# Patient Record
Sex: Male | Born: 1950
Health system: Southern US, Community
[De-identification: ages and names within clinical notes are randomized; demographics above are authoritative.]

## PROBLEM LIST (undated history)

## (undated) DIAGNOSIS — T4145XA Adverse effect of unspecified anesthetic, initial encounter: Secondary | ICD-10-CM

## (undated) DIAGNOSIS — Z8739 Personal history of other diseases of the musculoskeletal system and connective tissue: Secondary | ICD-10-CM

## (undated) DIAGNOSIS — Z87442 Personal history of urinary calculi: Secondary | ICD-10-CM

## (undated) DIAGNOSIS — M549 Dorsalgia, unspecified: Secondary | ICD-10-CM

## (undated) DIAGNOSIS — Z8619 Personal history of other infectious and parasitic diseases: Secondary | ICD-10-CM

## (undated) DIAGNOSIS — L719 Rosacea, unspecified: Secondary | ICD-10-CM

## (undated) DIAGNOSIS — G8929 Other chronic pain: Secondary | ICD-10-CM

## (undated) DIAGNOSIS — E785 Hyperlipidemia, unspecified: Secondary | ICD-10-CM

## (undated) DIAGNOSIS — E119 Type 2 diabetes mellitus without complications: Secondary | ICD-10-CM

## (undated) DIAGNOSIS — T8859XA Other complications of anesthesia, initial encounter: Secondary | ICD-10-CM

## (undated) HISTORY — PX: COLONOSCOPY: SHX174

---
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
## 1988-10-25 DIAGNOSIS — Z8619 Personal history of other infectious and parasitic diseases: Secondary | ICD-10-CM

## 1988-10-25 HISTORY — DX: Personal history of other infectious and parasitic diseases: Z86.19

## 1996-10-25 HISTORY — PX: CERVICAL FUSION: SHX112

## 2002-06-25 ENCOUNTER — Emergency Department (HOSPITAL_COMMUNITY): Admission: EM | Admit: 2002-06-25 | Discharge: 2002-06-25 | Payer: Self-pay | Admitting: Emergency Medicine

## 2014-02-11 ENCOUNTER — Other Ambulatory Visit: Payer: Self-pay | Admitting: Orthopedic Surgery

## 2014-02-11 DIAGNOSIS — M545 Low back pain, unspecified: Secondary | ICD-10-CM

## 2014-02-18 ENCOUNTER — Ambulatory Visit
Admission: RE | Admit: 2014-02-18 | Discharge: 2014-02-18 | Disposition: A | Discharge status: Home / Self Care | Payer: BC Managed Care – PPO | Source: Ambulatory Visit | Attending: Orthopedic Surgery | Admitting: Orthopedic Surgery

## 2014-02-18 DIAGNOSIS — M545 Low back pain, unspecified: Secondary | ICD-10-CM

## 2014-03-13 ENCOUNTER — Other Ambulatory Visit: Payer: Self-pay | Admitting: Neurosurgery

## 2014-04-04 ENCOUNTER — Encounter (HOSPITAL_COMMUNITY): Payer: Self-pay | Admitting: Pharmacy Technician

## 2014-04-04 NOTE — Pre-Procedure Instructions (Signed)
Mario [name]  04/04/2014   Your procedure is scheduled on:  Mon, June 22 @ 1:20 PM  Report to [facility]  at 10:20 AM.  Call this number if you have problems the morning of surgery: [phone]   Remember:   Do not eat food or drink liquids after midnight.                Stop taking your Aspirin,Advil,and Fish Oil. No Goody's,BC's,Aleve,or any Herbal Medications   Do not wear jewelry  Do not wear lotions, powders, or colognes. You may wear deodorant.  Men may shave face and neck.  Do not bring valuables to the hospital.  [facility] is not responsible                  for any belongings or valuables.               Contacts, dentures or bridgework may not be worn into surgery.  Leave suitcase in the car. After surgery it may be brought to your room.  For patients admitted to the hospital, discharge time is determined by your                treatment team.               Patients discharged the day of surgery will not be allowed to drive  home.    Special Instructions:  [place] - Preparing for Surgery  Before surgery, you can play an important role.  Because skin is not sterile, your skin needs to be as free of germs as possible.  You can reduce the number of germs on you skin by washing with CHG (chlorahexidine gluconate) soap before surgery.  CHG is an antiseptic cleaner which kills germs and bonds with the skin to continue killing germs even after washing.  Please DO NOT use if you have an allergy to CHG or antibacterial soaps.  If your skin becomes reddened/irritated stop using the CHG and inform your nurse when you arrive at Short Stay.  Do not shave (including legs and underarms) for at least 48 hours prior to the first CHG shower.  You may shave your face.  Please follow these instructions carefully:   1.  Shower with CHG Soap the night before surgery and the                                morning of Surgery.  2.  If you choose to wash your hair, wash your hair  first as usual with your       normal shampoo.  3.  After you shampoo, rinse your hair and body thoroughly to remove the                      Shampoo.  4.  Use CHG as you would any other liquid soap.  You can apply chg directly       to the skin and wash gently with scrungie or a clean washcloth.  5.  Apply the CHG Soap to your body ONLY FROM THE NECK DOWN.        Do not use on open wounds or open sores.  Avoid contact with your eyes,       ears, mouth and genitals (private parts).  Wash genitals (private parts)       with your normal soap.  6.  Wash thoroughly, paying special attention to the area where your surgery        will be performed.  7.  Thoroughly rinse your body with warm water from the neck down.  8.  DO NOT shower/wash with your normal soap after using and rinsing off       the CHG Soap.  9.  Pat yourself dry with a clean towel.            10.  Wear clean pajamas.            11.  Place clean sheets on your bed the night of your first shower and do not        sleep with pets.  Day of Surgery  Do not apply any lotions/deoderants the morning of surgery.  Please wear clean clothes to the hospital/surgery center.     Please read over the following fact sheets that you were given: Pain Booklet, Coughing and Deep Breathing, MRSA Information and Surgical Site Infection Prevention

## 2014-04-05 ENCOUNTER — Ambulatory Visit (HOSPITAL_COMMUNITY)
Admission: RE | Admit: 2014-04-05 | Discharge: 2014-04-05 | Disposition: A | Discharge status: Home / Self Care | Payer: BC Managed Care – PPO | Source: Ambulatory Visit | Attending: Anesthesiology | Admitting: Anesthesiology

## 2014-04-05 ENCOUNTER — Encounter (HOSPITAL_COMMUNITY)
Admission: RE | Admit: 2014-04-05 | Discharge: 2014-04-05 | Disposition: A | Discharge status: Home / Self Care | Payer: BC Managed Care – PPO | Source: Ambulatory Visit | Attending: Neurosurgery | Admitting: Neurosurgery

## 2014-04-05 ENCOUNTER — Other Ambulatory Visit: Payer: Self-pay

## 2014-04-05 ENCOUNTER — Encounter (HOSPITAL_COMMUNITY): Payer: Self-pay

## 2014-04-05 DIAGNOSIS — Z01818 Encounter for other preprocedural examination: Secondary | ICD-10-CM | POA: Insufficient documentation

## 2014-04-05 DIAGNOSIS — Z01812 Encounter for preprocedural laboratory examination: Secondary | ICD-10-CM | POA: Insufficient documentation

## 2014-04-05 DIAGNOSIS — Z01810 Encounter for preprocedural cardiovascular examination: Secondary | ICD-10-CM | POA: Insufficient documentation

## 2014-04-05 HISTORY — DX: Hyperlipidemia, unspecified: E78.5

## 2014-04-05 HISTORY — DX: Personal history of urinary calculi: Z87.442

## 2014-04-05 HISTORY — DX: Other complications of anesthesia, initial encounter: T88.59XA

## 2014-04-05 HISTORY — DX: Type 2 diabetes mellitus without complications: E11.9

## 2014-04-05 HISTORY — DX: Personal history of other diseases of the musculoskeletal system and connective tissue: Z87.39

## 2014-04-05 HISTORY — DX: Rosacea, unspecified: L71.9

## 2014-04-05 HISTORY — DX: Other chronic pain: G89.29

## 2014-04-05 HISTORY — DX: Personal history of other infectious and parasitic diseases: Z86.19

## 2014-04-05 HISTORY — DX: Dorsalgia, unspecified: M54.9

## 2014-04-05 HISTORY — DX: Adverse effect of unspecified anesthetic, initial encounter: T41.45XA

## 2014-04-05 LAB — CBC
HCT: 43.5 % (ref 39.0–52.0)
Hemoglobin: 15 g/dL (ref 13.0–17.0)
MCH: 31.8 pg (ref 26.0–34.0)
MCHC: 34.5 g/dL (ref 30.0–36.0)
MCV: 92.4 fL (ref 78.0–100.0)
PLATELETS: 150 10*3/uL (ref 150–400)
RBC: 4.71 MIL/uL (ref 4.22–5.81)
RDW: 13.5 % (ref 11.5–15.5)
WBC: 5.3 10*3/uL (ref 4.0–10.5)

## 2014-04-05 LAB — SURGICAL PCR SCREEN
MRSA, PCR: NEGATIVE
Staphylococcus aureus: POSITIVE — AB

## 2014-04-05 LAB — BASIC METABOLIC PANEL
BUN: 13 mg/dL (ref 6–23)
CALCIUM: 9.9 mg/dL (ref 8.4–10.5)
CHLORIDE: 109 meq/L (ref 96–112)
CO2: 25 mEq/L (ref 19–32)
CREATININE: 0.87 mg/dL (ref 0.50–1.35)
GFR calc Af Amer: >90 mL/min (ref >90)
GFR calc non Af Amer: >90 mL/min (ref >90)
GLUCOSE: 106 mg/dL — AB (ref 70–99)
Potassium: 4.4 mEq/L (ref 3.7–5.3)
Sodium: 146 mEq/L (ref 137–147)

## 2014-04-05 NOTE — Progress Notes (Signed)
Mupirocin script called into the [facility] in [place]

## 2014-04-05 NOTE — Progress Notes (Signed)
Takes Benazepril to protect kidneys not for HTN

## 2014-04-05 NOTE — Progress Notes (Signed)
Pt doesn't have a cardiologist  Denies ever having an echo/stress test/heart cath  Denies EKG or CXR in past yr  Medical Md is with [facility] in [place]

## 2014-04-14 MED ORDER — CEFAZOLIN SODIUM-DEXTROSE 2-3 GM-% IV SOLR
2.0000 g | INTRAVENOUS | Status: AC
  Administered 2014-04-15: 2 g via INTRAVENOUS
  Filled 2014-04-14: qty 50

## 2014-04-15 ENCOUNTER — Ambulatory Visit (HOSPITAL_COMMUNITY): Payer: BC Managed Care – PPO

## 2014-04-15 ENCOUNTER — Encounter (HOSPITAL_COMMUNITY)
Admission: RE | Disposition: A | Discharge status: Home / Self Care | Payer: Self-pay | Source: Ambulatory Visit | Attending: Neurosurgery

## 2014-04-15 ENCOUNTER — Ambulatory Visit (HOSPITAL_COMMUNITY): Payer: BC Managed Care – PPO | Admitting: Certified Registered"

## 2014-04-15 ENCOUNTER — Encounter (HOSPITAL_COMMUNITY): Payer: BC Managed Care – PPO | Admitting: Certified Registered"

## 2014-04-15 ENCOUNTER — Encounter (HOSPITAL_COMMUNITY): Payer: Self-pay | Admitting: Certified Registered Nurse Anesthetist

## 2014-04-15 ENCOUNTER — Observation Stay (HOSPITAL_COMMUNITY)
Admission: RE | Admit: 2014-04-15 | Discharge: 2014-04-16 | Disposition: A | Discharge status: Home / Self Care | Payer: BC Managed Care – PPO | Source: Ambulatory Visit | Attending: Neurosurgery | Admitting: Neurosurgery

## 2014-04-15 DIAGNOSIS — IMO0002 Reserved for concepts with insufficient information to code with codable children: Secondary | ICD-10-CM | POA: Insufficient documentation

## 2014-04-15 DIAGNOSIS — N9989 Other postprocedural complications and disorders of genitourinary system: Secondary | ICD-10-CM | POA: Insufficient documentation

## 2014-04-15 DIAGNOSIS — M47817 Spondylosis without myelopathy or radiculopathy, lumbosacral region: Principal | ICD-10-CM | POA: Insufficient documentation

## 2014-04-15 DIAGNOSIS — E119 Type 2 diabetes mellitus without complications: Secondary | ICD-10-CM | POA: Insufficient documentation

## 2014-04-15 DIAGNOSIS — Z7982 Long term (current) use of aspirin: Secondary | ICD-10-CM | POA: Insufficient documentation

## 2014-04-15 DIAGNOSIS — M48061 Spinal stenosis, lumbar region without neurogenic claudication: Secondary | ICD-10-CM | POA: Diagnosis present

## 2014-04-15 DIAGNOSIS — G8929 Other chronic pain: Secondary | ICD-10-CM | POA: Insufficient documentation

## 2014-04-15 DIAGNOSIS — R338 Other retention of urine: Secondary | ICD-10-CM | POA: Insufficient documentation

## 2014-04-15 DIAGNOSIS — L719 Rosacea, unspecified: Secondary | ICD-10-CM | POA: Insufficient documentation

## 2014-04-15 DIAGNOSIS — E785 Hyperlipidemia, unspecified: Secondary | ICD-10-CM | POA: Insufficient documentation

## 2014-04-15 HISTORY — PX: LUMBAR LAMINECTOMY/DECOMPRESSION MICRODISCECTOMY: SHX5026

## 2014-04-15 LAB — GLUCOSE, CAPILLARY
Glucose-Capillary: 130 mg/dL — ABNORMAL HIGH (ref 70–99)
Glucose-Capillary: 107 mg/dL — ABNORMAL HIGH (ref 70–99)
Glucose-Capillary: 112 mg/dL — ABNORMAL HIGH (ref 70–99)
Glucose-Capillary: 189 mg/dL — ABNORMAL HIGH (ref 70–99)

## 2014-04-15 SURGERY — LUMBAR LAMINECTOMY/DECOMPRESSION MICRODISCECTOMY 2 LEVELS
Anesthesia: General

## 2014-04-15 MED ORDER — ROCURONIUM BROMIDE 100 MG/10ML IV SOLN
INTRAVENOUS | Status: DC | PRN
  Administered 2014-04-15: 10 mg via INTRAVENOUS
  Administered 2014-04-15: 40 mg via INTRAVENOUS

## 2014-04-15 MED ORDER — ALUM & MAG HYDROXIDE-SIMETH 200-200-20 MG/5ML PO SUSP: 30.0000 mL | Freq: Four times a day (QID) | ORAL | Status: DC | PRN

## 2014-04-15 MED ORDER — FENTANYL CITRATE 0.05 MG/ML IJ SOLN
INTRAMUSCULAR | Status: DC | PRN
  Administered 2014-04-15 (×2): 100 ug via INTRAVENOUS
  Administered 2014-04-15: 50 ug via INTRAVENOUS

## 2014-04-15 MED ORDER — HYDROMORPHONE HCL PF 1 MG/ML IJ SOLN
INTRAMUSCULAR | Status: AC
  Administered 2014-04-15: 0.5 mg via INTRAVENOUS
  Filled 2014-04-15: qty 1

## 2014-04-15 MED ORDER — METFORMIN HCL 500 MG PO TABS
1000.0000 mg | ORAL_TABLET | Freq: Two times a day (BID) | ORAL | Status: DC
  Administered 2014-04-16: 1000 mg via ORAL
  Filled 2014-04-15 (×3): qty 2

## 2014-04-15 MED ORDER — SODIUM CHLORIDE 0.9 % IR SOLN
Status: DC | PRN
  Administered 2014-04-15: 17:00:00

## 2014-04-15 MED ORDER — BUPIVACAINE LIPOSOME 1.3 % IJ SUSP
INTRAMUSCULAR | Status: DC | PRN
  Administered 2014-04-15: 20 mL

## 2014-04-15 MED ORDER — ONDANSETRON HCL 4 MG/2ML IJ SOLN: 4.0000 mg | Freq: Once | INTRAMUSCULAR | Status: DC

## 2014-04-15 MED ORDER — ARTIFICIAL TEARS OP OINT
TOPICAL_OINTMENT | OPHTHALMIC | Status: DC | PRN
  Administered 2014-04-15: 1 via OPHTHALMIC

## 2014-04-15 MED ORDER — CEFAZOLIN SODIUM-DEXTROSE 2-3 GM-% IV SOLR
2.0000 g | Freq: Three times a day (TID) | INTRAVENOUS | Status: AC
  Administered 2014-04-15 – 2014-04-16 (×2): 2 g via INTRAVENOUS
  Filled 2014-04-15 (×2): qty 50

## 2014-04-15 MED ORDER — GLYCOPYRROLATE 0.2 MG/ML IJ SOLN
INTRAMUSCULAR | Status: AC
  Filled 2014-04-15: qty 4

## 2014-04-15 MED ORDER — HEMOSTATIC AGENTS (NO CHARGE) OPTIME
TOPICAL | Status: DC | PRN
  Administered 2014-04-15: 1 via TOPICAL

## 2014-04-15 MED ORDER — OXYCODONE-ACETAMINOPHEN 5-325 MG PO TABS
1.0000 | ORAL_TABLET | ORAL | Status: DC | PRN
  Administered 2014-04-15 – 2014-04-16 (×4): 2 via ORAL
  Filled 2014-04-15 (×4): qty 2

## 2014-04-15 MED ORDER — NEOSTIGMINE METHYLSULFATE 10 MG/10ML IV SOLN
INTRAVENOUS | Status: AC
  Filled 2014-04-15: qty 2

## 2014-04-15 MED ORDER — MIDAZOLAM HCL 5 MG/5ML IJ SOLN
INTRAMUSCULAR | Status: DC | PRN
  Administered 2014-04-15: 2 mg via INTRAVENOUS

## 2014-04-15 MED ORDER — NEOSTIGMINE METHYLSULFATE 10 MG/10ML IV SOLN
INTRAVENOUS | Status: DC | PRN
  Administered 2014-04-15: 4 mg via INTRAVENOUS

## 2014-04-15 MED ORDER — BUPIVACAINE LIPOSOME 1.3 % IJ SUSP
20.0000 mL | INTRAMUSCULAR | Status: DC
  Filled 2014-04-15: qty 20

## 2014-04-15 MED ORDER — THROMBIN 5000 UNITS EX SOLR
CUTANEOUS | Status: DC | PRN
  Administered 2014-04-15 (×2): 5000 [IU] via TOPICAL

## 2014-04-15 MED ORDER — PHENYLEPHRINE HCL 10 MG/ML IJ SOLN
INTRAMUSCULAR | Status: DC | PRN
  Administered 2014-04-15 (×3): 80 ug via INTRAVENOUS
  Administered 2014-04-15: 40 ug via INTRAVENOUS
  Administered 2014-04-15: 80 ug via INTRAVENOUS
  Administered 2014-04-15: 40 ug via INTRAVENOUS

## 2014-04-15 MED ORDER — LIDOCAINE HCL (CARDIAC) 20 MG/ML IV SOLN
INTRAVENOUS | Status: DC | PRN
  Administered 2014-04-15: 80 mg via INTRAVENOUS

## 2014-04-15 MED ORDER — DOCUSATE SODIUM 100 MG PO CAPS
100.0000 mg | ORAL_CAPSULE | Freq: Two times a day (BID) | ORAL | Status: DC
  Administered 2014-04-15 – 2014-04-16 (×2): 100 mg via ORAL
  Filled 2014-04-15 (×3): qty 1

## 2014-04-15 MED ORDER — ACETAMINOPHEN 650 MG RE SUPP: 650.0000 mg | RECTAL | Status: DC | PRN

## 2014-04-15 MED ORDER — LACTATED RINGERS IV SOLN: INTRAVENOUS | Status: DC

## 2014-04-15 MED ORDER — PROPOFOL 10 MG/ML IV BOLUS
INTRAVENOUS | Status: DC | PRN
  Administered 2014-04-15: 50 mg via INTRAVENOUS
  Administered 2014-04-15: 150 mg via INTRAVENOUS

## 2014-04-15 MED ORDER — PHENYLEPHRINE HCL 10 MG/ML IJ SOLN
10.0000 mg | INTRAMUSCULAR | Status: DC | PRN
  Administered 2014-04-15: 20 ug/min via INTRAVENOUS

## 2014-04-15 MED ORDER — ACETAMINOPHEN 325 MG PO TABS: 650.0000 mg | ORAL_TABLET | ORAL | Status: DC | PRN

## 2014-04-15 MED ORDER — LACTATED RINGERS IV SOLN
INTRAVENOUS | Status: DC | PRN
  Administered 2014-04-15 (×2): via INTRAVENOUS

## 2014-04-15 MED ORDER — ONDANSETRON HCL 4 MG/2ML IJ SOLN
INTRAMUSCULAR | Status: DC | PRN
  Administered 2014-04-15: 4 mg via INTRAVENOUS

## 2014-04-15 MED ORDER — METRONIDAZOLE 0.75 % EX CREA
1.0000 "application " | TOPICAL_CREAM | Freq: Two times a day (BID) | CUTANEOUS | Status: DC
  Filled 2014-04-15: qty 45

## 2014-04-15 MED ORDER — MENTHOL 3 MG MT LOZG: 1.0000 | LOZENGE | OROMUCOSAL | Status: DC | PRN

## 2014-04-15 MED ORDER — BUPIVACAINE-EPINEPHRINE (PF) 0.5% -1:200000 IJ SOLN
INTRAMUSCULAR | Status: DC | PRN
  Administered 2014-04-15: 10 mL via PERINEURAL

## 2014-04-15 MED ORDER — PHENYLEPHRINE 40 MCG/ML (10ML) SYRINGE FOR IV PUSH (FOR BLOOD PRESSURE SUPPORT)
PREFILLED_SYRINGE | INTRAVENOUS | Status: AC
  Filled 2014-04-15: qty 10

## 2014-04-15 MED ORDER — HYDROMORPHONE HCL PF 1 MG/ML IJ SOLN
0.2500 mg | INTRAMUSCULAR | Status: DC | PRN
  Administered 2014-04-15 (×2): 0.5 mg via INTRAVENOUS

## 2014-04-15 MED ORDER — HYDROCODONE-ACETAMINOPHEN 5-325 MG PO TABS: 1.0000 | ORAL_TABLET | ORAL | Status: DC | PRN

## 2014-04-15 MED ORDER — GLIMEPIRIDE 2 MG PO TABS
2.0000 mg | ORAL_TABLET | Freq: Two times a day (BID) | ORAL | Status: DC
  Administered 2014-04-16: 2 mg via ORAL
  Filled 2014-04-15 (×3): qty 1

## 2014-04-15 MED ORDER — ATORVASTATIN CALCIUM 40 MG PO TABS
40.0000 mg | ORAL_TABLET | Freq: Every day | ORAL | Status: DC
  Administered 2014-04-15: 40 mg via ORAL
  Filled 2014-04-15 (×2): qty 1

## 2014-04-15 MED ORDER — PHENOL 1.4 % MT LIQD: 1.0000 | OROMUCOSAL | Status: DC | PRN

## 2014-04-15 MED ORDER — ALBUMIN HUMAN 5 % IV SOLN
INTRAVENOUS | Status: DC | PRN
  Administered 2014-04-15: 17:00:00 via INTRAVENOUS

## 2014-04-15 MED ORDER — ONDANSETRON HCL 4 MG/2ML IJ SOLN
4.0000 mg | INTRAMUSCULAR | Status: DC | PRN
  Administered 2014-04-15: 4 mg via INTRAVENOUS
  Filled 2014-04-15: qty 2

## 2014-04-15 MED ORDER — FENTANYL CITRATE 0.05 MG/ML IJ SOLN
INTRAMUSCULAR | Status: AC
  Filled 2014-04-15: qty 5

## 2014-04-15 MED ORDER — ONDANSETRON HCL 4 MG/2ML IJ SOLN
INTRAMUSCULAR | Status: AC
Start: 2014-04-15 — End: 2014-04-16
  Filled 2014-04-15: qty 2

## 2014-04-15 MED ORDER — BACITRACIN ZINC 500 UNIT/GM EX OINT
TOPICAL_OINTMENT | CUTANEOUS | Status: DC | PRN
  Administered 2014-04-15: 1 via TOPICAL

## 2014-04-15 MED ORDER — MORPHINE SULFATE 2 MG/ML IJ SOLN
1.0000 mg | INTRAMUSCULAR | Status: DC | PRN
Start: 2014-04-15 — End: 2014-04-16

## 2014-04-15 MED ORDER — BENAZEPRIL HCL 5 MG PO TABS
5.0000 mg | ORAL_TABLET | Freq: Every day | ORAL | Status: DC
  Administered 2014-04-15 – 2014-04-16 (×2): 5 mg via ORAL
  Filled 2014-04-15 (×2): qty 1

## 2014-04-15 MED ORDER — DIAZEPAM 5 MG PO TABS
5.0000 mg | ORAL_TABLET | Freq: Four times a day (QID) | ORAL | Status: DC | PRN
  Administered 2014-04-15: 5 mg via ORAL
  Filled 2014-04-15: qty 1

## 2014-04-15 MED ORDER — MIDAZOLAM HCL 2 MG/2ML IJ SOLN
INTRAMUSCULAR | Status: AC
  Filled 2014-04-15: qty 2

## 2014-04-15 MED ORDER — METRONIDAZOLE 0.75 % EX GEL
Freq: Two times a day (BID) | CUTANEOUS | Status: DC
  Administered 2014-04-15 – 2014-04-16 (×2): via TOPICAL
  Filled 2014-04-15: qty 45

## 2014-04-15 MED ORDER — 0.9 % SODIUM CHLORIDE (POUR BTL) OPTIME
TOPICAL | Status: DC | PRN
  Administered 2014-04-15: 1000 mL

## 2014-04-15 MED ORDER — GLYCOPYRROLATE 0.2 MG/ML IJ SOLN
INTRAMUSCULAR | Status: DC | PRN
  Administered 2014-04-15: 0.6 mg via INTRAVENOUS

## 2014-04-15 MED ORDER — LACTATED RINGERS IV SOLN
INTRAVENOUS | Status: DC
  Administered 2014-04-15: 50 mL/h via INTRAVENOUS

## 2014-04-15 SURGICAL SUPPLY — 59 items
APL SKNCLS STERI-STRIP NONHPOA (GAUZE/BANDAGES/DRESSINGS) ×1
BAG DECANTER FOR FLEXI CONT (MISCELLANEOUS) ×3 IMPLANT
BENZOIN TINCTURE PRP APPL 2/3 (GAUZE/BANDAGES/DRESSINGS) ×3 IMPLANT
BLADE 10 SAFETY STRL DISP (BLADE) ×3 IMPLANT
BLADE SURG ROTATE 9660 (MISCELLANEOUS) IMPLANT
BRUSH SCRUB EZ PLAIN DRY (MISCELLANEOUS) ×3 IMPLANT
BUR ACORN 6.0 (BURR) ×2 IMPLANT
BUR ACORN 6.0MM (BURR) ×1
BUR MATCHSTICK NEURO 3.0 LAGG (BURR) ×3 IMPLANT
CANISTER SUCT 3000ML (MISCELLANEOUS) ×3 IMPLANT
CLOSURE WOUND 1/2 X4 (GAUZE/BANDAGES/DRESSINGS) ×1
CONT SPEC 4OZ CLIKSEAL STRL BL (MISCELLANEOUS) ×3 IMPLANT
DRAPE LAPAROTOMY 100X72X124 (DRAPES) ×3 IMPLANT
DRAPE MICROSCOPE LEICA (MISCELLANEOUS) ×3 IMPLANT
DRAPE POUCH INSTRU U-SHP 10X18 (DRAPES) ×3 IMPLANT
DRAPE SURG 17X23 STRL (DRAPES) ×12 IMPLANT
ELECT BLADE 4.0 EZ CLEAN MEGAD (MISCELLANEOUS) ×3
ELECT REM PT RETURN 9FT ADLT (ELECTROSURGICAL) ×3
ELECTRODE BLDE 4.0 EZ CLN MEGD (MISCELLANEOUS) ×1 IMPLANT
ELECTRODE REM PT RTRN 9FT ADLT (ELECTROSURGICAL) ×1 IMPLANT
GAUZE SPONGE 4X4 16PLY XRAY LF (GAUZE/BANDAGES/DRESSINGS) IMPLANT
GLOVE BIO SURGEON STRL SZ8 (GLOVE) ×2 IMPLANT
GLOVE BIO SURGEON STRL SZ8.5 (GLOVE) ×3 IMPLANT
GLOVE BIOGEL PI IND STRL 6.5 (GLOVE) IMPLANT
GLOVE BIOGEL PI INDICATOR 6.5 (GLOVE) ×6
GLOVE ECLIPSE 6.5 STRL STRAW (GLOVE) ×6 IMPLANT
GLOVE EXAM NITRILE LRG STRL (GLOVE) IMPLANT
GLOVE EXAM NITRILE MD LF STRL (GLOVE) IMPLANT
GLOVE EXAM NITRILE XL STR (GLOVE) IMPLANT
GLOVE EXAM NITRILE XS STR PU (GLOVE) IMPLANT
GLOVE INDICATOR 8.5 STRL (GLOVE) ×2 IMPLANT
GLOVE SS BIOGEL STRL SZ 8 (GLOVE) ×1 IMPLANT
GLOVE SUPERSENSE BIOGEL SZ 8 (GLOVE) ×2
GOWN STRL REUS W/ TWL LRG LVL3 (GOWN DISPOSABLE) IMPLANT
GOWN STRL REUS W/ TWL XL LVL3 (GOWN DISPOSABLE) ×1 IMPLANT
GOWN STRL REUS W/TWL 2XL LVL3 (GOWN DISPOSABLE) IMPLANT
GOWN STRL REUS W/TWL LRG LVL3 (GOWN DISPOSABLE) ×3
GOWN STRL REUS W/TWL XL LVL3 (GOWN DISPOSABLE) ×6
KIT BASIN OR (CUSTOM PROCEDURE TRAY) ×3 IMPLANT
KIT ROOM TURNOVER OR (KITS) ×3 IMPLANT
NDL HYPO 21X1.5 SAFETY (NEEDLE) IMPLANT
NEEDLE HYPO 21X1.5 SAFETY (NEEDLE) IMPLANT
NEEDLE HYPO 22GX1.5 SAFETY (NEEDLE) ×3 IMPLANT
NS IRRIG 1000ML POUR BTL (IV SOLUTION) ×3 IMPLANT
PACK LAMINECTOMY NEURO (CUSTOM PROCEDURE TRAY) ×3 IMPLANT
PAD ARMBOARD 7.5X6 YLW CONV (MISCELLANEOUS) ×9 IMPLANT
PATTIES SURGICAL .5 X1 (DISPOSABLE) IMPLANT
RUBBERBAND STERILE (MISCELLANEOUS) ×6 IMPLANT
SPONGE GAUZE 4X4 12PLY (GAUZE/BANDAGES/DRESSINGS) ×3 IMPLANT
SPONGE SURGIFOAM ABS GEL SZ50 (HEMOSTASIS) ×3 IMPLANT
STRIP CLOSURE SKIN 1/2X4 (GAUZE/BANDAGES/DRESSINGS) ×2 IMPLANT
SUT VIC AB 1 CT1 18XBRD ANBCTR (SUTURE) ×2 IMPLANT
SUT VIC AB 1 CT1 8-18 (SUTURE) ×6
SUT VIC AB 2-0 CP2 18 (SUTURE) ×6 IMPLANT
SYR 20CC LL (SYRINGE) IMPLANT
SYR 20ML ECCENTRIC (SYRINGE) ×3 IMPLANT
TOWEL OR 17X24 6PK STRL BLUE (TOWEL DISPOSABLE) ×3 IMPLANT
TOWEL OR 17X26 10 PK STRL BLUE (TOWEL DISPOSABLE) ×3 IMPLANT
WATER STERILE IRR 1000ML POUR (IV SOLUTION) ×3 IMPLANT

## 2014-04-15 NOTE — Anesthesia Preprocedure Evaluation (Addendum)
Anesthesia Evaluation  Patient identified by MRN, date of birth, ID band Patient awake    Reviewed: Allergy & Precautions, H&P , NPO status   Airway Mallampati: II TM Distance: >3 FB Neck ROM: Full    Dental  (+) Teeth Intact, Dental Advisory Given   Pulmonary neg pulmonary ROS,          Cardiovascular negative cardio ROS      Neuro/Psych    GI/Hepatic negative GI ROS, Neg liver ROS,   Endo/Other  diabetes, Well Controlled, Type 2, Oral Hypoglycemic Agents  Renal/GU negative Renal ROS     Musculoskeletal   Abdominal   Peds  Hematology   Anesthesia Other Findings   Reproductive/Obstetrics                       Anesthesia Physical Anesthesia Plan  ASA: III  Anesthesia Plan: General   Post-op Pain Management:    Induction: Intravenous  Airway Management Planned: Oral ETT  Additional Equipment:   Intra-op Plan:   Post-operative Plan: Extubation in OR  Informed Consent: I have reviewed the patients History and Physical, chart, labs and discussed the procedure including the risks, benefits and alternatives for the proposed anesthesia with the patient or authorized representative who has indicated his/her understanding and acceptance.   Dental advisory given  Plan Discussed with: CRNA, Anesthesiologist and Surgeon  Anesthesia Plan Comments:        Anesthesia Quick Evaluation

## 2014-04-15 NOTE — Op Note (Signed)
Brief history: The patient is a 63 year old white male who has complained of back and left leg pain consistent with neurogenic claudication. He has failed medical management and was worked up with a lumbar MRI. This demonstrated the patient had multilevel degenerative changes, bulging disc, et Ronney Asters. It also demonstrated spinal stenosis most prominent at L3-4 and L4-5. I discussed the various treatment option with the patient including surgery. He has weighed the risks, benefits, and alternatives surgery and decided proceed with an L3-4 and L4-5 decompressive laminectomy.  Preoperative diagnosis: L3-4 and L4-5 spinal stenosis, lumbago, lumbar radiculopathy, neurogenic claudication  Postoperative diagnosis: The same  Procedure: L3 and L4 laminectomy to decompress the bilateral L3, L4 and L5 nerve roots using micro-dissection  Surgeon: Dr. Earle Gell  Asst.: Dr. Dominica Severin cram  Anesthesia: Gen. endotracheal  Estimated blood loss: 100 cc  Drains: None  Complications: None  Description of procedure: The patient was brought to the operating room by the anesthesia team. General endotracheal anesthesia was induced. The patient was turned to the prone position on the Wilson frame. The patient's lumbosacral region was then prepared with Betadine scrub and Betadine solution. Sterile drapes were applied.  I then injected the area to be incised with Marcaine with epinephrine solution. I then used a scalpel to make a linear midline incision over the L3-4 and L4-5 intervertebral disc space. I then used electrocautery to perform a bilateral  subperiosteal dissection exposing the spinous process and lamina of L3, L4 and L5. We obtained intraoperative radiograph to confirm our location. I then inserted the [facility] retractor for exposure. I then used a scalpel to incise the interspinous ligament at L2-3, L3-4 and L4-5. I used Leksell nodule were to remove the spinous process of L3 and L4.  We then brought the  operative microscope into the field. Under its magnification and illumination we completed the microdissection. I used a high-speed drill to perform a laminotomy at L3 and L4 bilaterally. I then used a Kerrison punches to widen the laminotomy and removed the ligamentum flavum at L2-3, L3-4 and L4-5. We then used microdissection to free up the thecal sac and the bilateral L3, L4 and L5 nerve root from the epidural tissue. I then used a Kerrison punch to perform a foraminotomy at about the bilateral L3, L4 and L5 nerve root. I inspected the intervertebral disc at L3-4 and L4-5. There were no significant herniations.  I then palpated along the ventral surface of the thecal sac and along exit route of the bilateral L3, L4 and L5 nerve root and noted that the neural structures were well decompressed. This completed the decompression.  We then obtained hemostasis using bipolar electrocautery. We irrigated the wound out with bacitracin solution. We then removed the retractor. We then reapproximated the patient's thoracolumbar fascia with interrupted #1 Vicryl suture. We then reapproximated the patient's subcutaneous tissue with interrupted 2-0 Vicryl suture. We then reapproximated patient's skin with Steri-Strips and benzoin. The was then coated with bacitracin ointment. The drapes were removed. The patient was subsequently returned to the supine position where they were extubated by the anesthesia team. The patient was then transported to the postanesthesia care unit in stable condition. All sponge instrument and needle counts were reportedly correct at the end of this case.

## 2014-04-15 NOTE — Anesthesia Procedure Notes (Signed)
Procedure Name: Intubation Date/Time: 04/15/2014 3:35 PM Performed by: Blair Heys E Pre-anesthesia Checklist: Patient identified, Emergency Drugs available, Suction available and Patient being monitored Patient Re-evaluated:Patient Re-evaluated prior to inductionOxygen Delivery Method: Circle system utilized Preoxygenation: Pre-oxygenation with 100% oxygen Intubation Type: IV induction Ventilation: Mask ventilation without difficulty and Oral airway inserted - appropriate to patient size Laryngoscope Size: Sabra Heck and 2 Grade View: Grade I Tube type: Oral Tube size: 7.5 mm Number of attempts: 1 Airway Equipment and Method: Stylet and Oral airway Placement Confirmation: ETT inserted through vocal cords under direct vision,  positive ETCO2 and breath sounds checked- equal and bilateral Secured at: 23 cm Tube secured with: Tape Dental Injury: Teeth and Oropharynx as per pre-operative assessment

## 2014-04-15 NOTE — H&P (Signed)
Subjective: Mario [name] is a 63 year old white male who has complained of back and left leg pain. He has failed medical management and was worked up with a lumbar MRI. This demonstrated multifactorial spinal stenosis at L3-4 and L4-5 . I discussed the various treatment options with the patient including surgery. He has weighed the risks, benefits, and alternatives and decided proceed with an L3-4 and L4-5 laminectomy.  Past Medical History  Diagnosis Date  . Hyperlipidemia     takes Atorvastatin daily  . Diabetes mellitus without complication     takes Metformin and Amaryl daily  . Complication of anesthesia     woke up being very agitated  . History of gout 17yrs ago  . Chronic back pain     radiculopatly  and stenosis  . Rosacea     uses Metronidazole daily  . History of kidney stones 30+yrs ago  . History of shingles 1990    Past Surgical History  Procedure Laterality Date  . Tonsillectomy  1958  . Cervical fusion  1998  . Colonoscopy      Allergies  Allergen Reactions  . Adhesive [Tape]     Breaks out    History  Substance Use Topics  . Smoking status: Never Smoker   . Smokeless tobacco: Not on file  . Alcohol Use: No    No family history on file. Prior to Admission medications   Medication Sig Start Date End Date Taking? Authorizing Provider  aspirin EC 325 MG tablet Take 325 mg by mouth daily.   Yes Historical Provider, MD  atorvastatin (LIPITOR) 40 MG tablet Take 40 mg by mouth at bedtime.   Yes Historical Provider, MD  benazepril (LOTENSIN) 5 MG tablet Take 5 mg by mouth daily.   Yes Historical Provider, MD  glimepiride (AMARYL) 2 MG tablet Take 2 mg by mouth 2 (two) times daily.   Yes Historical Provider, MD  Ibuprofen-Diphenhydramine Cit (ADVIL PM PO) Take 2 tablets by mouth at bedtime.   Yes Historical Provider, MD  metFORMIN (GLUCOPHAGE) 1000 MG tablet Take 1,000 mg by mouth 2 (two) times daily with a meal.   Yes Historical Provider, MD  metroNIDAZOLE  (METROCREAM) 0.75 % cream Apply 1 application topically 2 (two) times daily. To face   Yes Historical Provider, MD  Multiple Vitamins-Minerals (OCUVITE PO) Take 1 tablet by mouth daily.   Yes Historical Provider, MD  Omega-3 Fatty Acids (FISH OIL) 1200 MG CAPS Take 2,400 mg by mouth daily.   Yes Historical Provider, MD     Review of Systems  Positive ROS: As above  All other systems have been reviewed and were otherwise negative with the exception of those mentioned in the HPI and as above.  Objective: Vital signs in last 24 hours: Temp:  [97.8 F (36.6 C)] 97.8 F (36.6 C) (06/22 1033) Pulse Rate:  [68] 68 (06/22 1033) Resp:  [20] 20 (06/22 1033) BP: (165)/(54) 165/54 mmHg (06/22 1033) SpO2:  [100 %] 100 % (06/22 1033) Weight:  [99.746 kg (219 lb 14.4 oz)] 99.746 kg (219 lb 14.4 oz) (06/22 1033)  General Appearance: Alert, cooperative, no distress, Head: Normocephalic, without obvious abnormality, atraumatic Eyes: PERRL, conjunctiva/corneas clear, EOM's intact,    Ears: Normal  Throat: Normal  Neck: Supple, symmetrical, trachea midline, no adenopathy; thyroid: No enlargement/tenderness/nodules; no carotid bruit or JVD Back: Symmetric, no curvature, ROM normal, no CVA tenderness Lungs: Clear to auscultation bilaterally, respirations unlabored Heart: Regular rate and rhythm, no murmur, rub or gallop Abdomen: Soft,  non-tender,, no masses, no organomegaly Extremities: Extremities normal, atraumatic, no cyanosis or edema Pulses: 2+ and symmetric all extremities Skin: Skin color, texture, turgor normal, no rashes or lesions  NEUROLOGIC:   Mental status: alert and oriented, no aphasia, good attention span, Fund of knowledge/ memory ok Motor Exam - grossly normal Sensory Exam - grossly normal Reflexes:  Coordination - grossly normal Gait - grossly normal Balance - grossly normal Cranial Nerves: I: smell Not tested  II: visual acuity  OS: Normal  OD: Normal   II: visual fields  Full to confrontation  II: pupils Equal, round, reactive to light  III,VII: ptosis None  III,IV,VI: extraocular muscles  Full ROM  V: mastication Normal  V: facial light touch sensation  Normal  V,VII: corneal reflex  Present  VII: facial muscle function - upper  Normal  VII: facial muscle function - lower Normal  VIII: hearing Not tested  IX: soft palate elevation  Normal  IX,X: gag reflex Present  XI: trapezius strength  5/5  XI: sternocleidomastoid strength 5/5  XI: neck flexion strength  5/5  XII: tongue strength  Normal    Data Review Lab Results  Component Value Date   WBC 5.3 04/05/2014   HGB 15.0 04/05/2014   HCT 43.5 04/05/2014   MCV 92.4 04/05/2014   PLT 150 04/05/2014   Lab Results  Component Value Date   NA 146 04/05/2014   K 4.4 04/05/2014   CL 109 04/05/2014   CO2 25 04/05/2014   BUN 13 04/05/2014   CREATININE 0.87 04/05/2014   GLUCOSE 106* 04/05/2014   No results found for this basename: INR, PROTIME    Assessment/Plan: L3-4 and L4-5 spinal stenosis, lumbago, lumbar radiculopathy, neurogenic claudication: I discussed situation with the patient and his wife. I reviewed the imaging studies with them and pointed out the abnormalities. We have discussed the various treatment options including surgery. I described the surgical treatment option of an L3-4 and L4-5 laminectomy. I have shown him surgical models. We have discussed the risks, benefits, alternatives, and likelihood of achieving our goals with surgery. I've answered all their questions. He has decided to proceed with surgery.   [name],Mario D 04/15/2014 3:14 PM

## 2014-04-15 NOTE — Anesthesia Postprocedure Evaluation (Signed)
  Anesthesia Post-op Note  Patient: Mario [name]  Procedure(s) Performed: Procedure(s): LUMBAR LAMINECTOMY/DECOMPRESSION MICRODISCECTOMY 2 LEVELS  lumbar three/four, four/five   (N/A)  Patient Location: PACU  Anesthesia Type:General  Level of Consciousness: awake, alert  and oriented  Airway and Oxygen Therapy: Patient Spontanous Breathing and Patient connected to nasal cannula oxygen   Post-op Pain: mild  Post-op Assessment: Post-op Vital signs reviewed, Patient's Cardiovascular Status Stable, Respiratory Function Stable, Patent Airway and Pain level controlled  Post-op Vital Signs: stable  Last Vitals:  Filed Vitals:   04/15/14 1800  BP: 139/67  Pulse: 92  Temp:   Resp: 22    Complications: No apparent anesthesia complications

## 2014-04-15 NOTE — Progress Notes (Signed)
Subjective:  The patient is alert and pleasant. He looks well. He is in no apparent distress.  Objective: Vital signs in last 24 hours: Temp:  [97.8 F (36.6 C)-98.2 F (36.8 C)] 98.2 F (36.8 C) (06/22 1745) Pulse Rate:  [68-104] 92 (06/22 1800) Resp:  [20-37] 22 (06/22 1800) BP: (139-165)/(54-68) 139/67 mmHg (06/22 1800) SpO2:  [98 %-100 %] 98 % (06/22 1800) Weight:  [99.746 kg (219 lb 14.4 oz)] 99.746 kg (219 lb 14.4 oz) (06/22 1033)  Intake/Output from previous day:   Intake/Output this shift: Total I/O In: 1750 [I.V.:1500; IV Piggyback:250] Out: 250 [Blood:250]  Physical exam the patient is alert and pleasant. He is moving his lower extremities well.  Lab Results: No results found for this basename: WBC, HGB, HCT, PLT,  in the last 72 hours BMET No results found for this basename: NA, K, CL, CO2, GLUCOSE, BUN, CREATININE, CALCIUM,  in the last 72 hours  Studies/Results: Dg Lumbar Spine 1 View  04/15/2014   CLINICAL DATA:  Status post laminectomy  EXAM: LUMBAR SPINE - 1 VIEW  COMPARISON:  02/18/2014  FINDINGS: Using the same numbering scheme as the previous exam the vertebra are labeled. There are tissue spreaders and suture material posterior to the L5 for vertebra. A surgical probe is posterior to and directed towards the L5 vertebra.  IMPRESSION: 1. Surgical probe localization of the L5 vertebra.   Electronically Signed   By: Kerby Moors M.D.   On: 04/15/2014 16:45    Assessment/Plan: The patient is doing well. I spoke with his wife.  LOS: 0 days     JENKINS,JEFFREY D 04/15/2014, 6:09 PM

## 2014-04-15 NOTE — Transfer of Care (Signed)
Immediate Anesthesia Transfer of Care Note  Patient: Mario [name]  Procedure(s) Performed: Procedure(s): LUMBAR LAMINECTOMY/DECOMPRESSION MICRODISCECTOMY 2 LEVELS  lumbar three/four, four/five   (N/A)  Patient Location: PACU  Anesthesia Type:General  Level of Consciousness: awake and alert   Airway & Oxygen Therapy: Patient Spontanous Breathing and Patient connected to nasal cannula oxygen  Post-op Assessment: Report given to PACU RN, Post -op Vital signs reviewed and stable and Patient moving all extremities X 4  Post vital signs: Reviewed and stable  Complications: No apparent anesthesia complications

## 2014-04-15 NOTE — Plan of Care (Signed)
Problem: Consults Goal: Diagnosis - Spinal Surgery Outcome: Completed/Met Date Met:  04/15/14 Lumbar Laminectomy (Complex)

## 2014-04-16 ENCOUNTER — Encounter (HOSPITAL_COMMUNITY): Payer: Self-pay | Admitting: Neurosurgery

## 2014-04-16 LAB — GLUCOSE, CAPILLARY
GLUCOSE-CAPILLARY: 158 mg/dL — AB (ref 70–99)
Glucose-Capillary: 137 mg/dL — ABNORMAL HIGH (ref 70–99)

## 2014-04-16 MED ORDER — TAMSULOSIN HCL 0.4 MG PO CAPS
0.4000 mg | ORAL_CAPSULE | Freq: Every day | ORAL | Status: DC
Start: 2014-04-16 — End: 2014-04-16
  Administered 2014-04-16: 0.4 mg via ORAL
  Filled 2014-04-16: qty 1

## 2014-04-16 MED ORDER — DSS 100 MG PO CAPS: 100.0000 mg | ORAL_CAPSULE | Freq: Two times a day (BID) | ORAL | Status: DC

## 2014-04-16 MED ORDER — DIAZEPAM 5 MG PO TABS: 5.0000 mg | ORAL_TABLET | Freq: Four times a day (QID) | ORAL | Status: DC | PRN

## 2014-04-16 MED ORDER — OXYCODONE-ACETAMINOPHEN 10-325 MG PO TABS: 1.0000 | ORAL_TABLET | ORAL | Status: DC | PRN

## 2014-04-16 NOTE — Discharge Instructions (Signed)
Wound Care Keep incision covered and dry for 3 DAYS. You may remove outer bandage after 3 days. Do not put any creams, lotions, or ointments on incision. Leave steri-strips on back.  They will fall off by themselves. Activity Walk each and every day, increasing distance each day. No lifting greater than 5 lbs.  No Bending, No lifting, and No twisting. No driving for 2 weeks; may ride as a passenger locally.  Diet Resume your normal diet.  Return to Work Will be discussed at you follow up appointment. Call Your Doctor If Any of These Occur Redness, drainage, or swelling at the wound.  Temperature greater than 101 degrees. Severe pain not relieved by pain medication.  Incision starts to come apart. Follow Up Appt Call today for appointment in 3 weeks ([phone]) or for problems.  If you have any hardware placed in your spine, you will need an x-ray before your appointment.

## 2014-04-16 NOTE — Progress Notes (Signed)
Utilization review completed.  

## 2014-04-16 NOTE — Evaluation (Signed)
Physical Therapy Evaluation/ Discharge Patient Details Name: Mario [name] MRN: [id number] DOB: [birth year] Today's Date: 04/16/2014   History of Present Illness  pt s/p lami L3-5  Clinical Impression  Pt very pleasant and moving well. On arrival pt in recliner with legs out and back in flexed position. Pt educated for transfers, positioning, home management and precautions with handout provided. Pt verbalized understanding of all education with all questions answered and no further needs at this time.     Follow Up Recommendations No PT follow up    Equipment Recommendations  None recommended by PT    Recommendations for Other Services       Precautions / Restrictions Precautions Precautions: Back Precaution Booklet Issued: Yes (comment)      Mobility  Bed Mobility Overal bed mobility: Modified Independent             General bed mobility comments: Pt educated for bed mobility and able to return demonstrate on second trial  Transfers Overall transfer level: Modified independent                  Ambulation/Gait Ambulation/Gait assistance: Modified independent (Device/Increase time) Ambulation Distance (Feet): 400 Feet Assistive device: None Gait Pattern/deviations: Step-through pattern;Decreased stride length     General Gait Details: pt with slight limp of LLE, pt states no pain but has been favoring RLE recently due to back pain  Stairs Stairs: Yes Stairs assistance: Modified independent (Device/Increase time) Stair Management: No rails;Step to pattern;Forwards Number of Stairs: 4    Wheelchair Mobility    Modified Rankin (Stroke Patients Only)       Balance                                             Pertinent Vitals/Pain 0/10 pain, premedicated    Home Living Family/patient expects to be discharged to:: Private residence Living Arrangements: Spouse/significant other Available Help at Discharge: Family;Available  PRN/intermittently Type of Home: House Home Access: Stairs to enter Entrance Stairs-Rails: None Entrance Stairs-Number of Steps: 4 Home Layout: One level Home Equipment: None      Prior Function Level of Independence: Independent               Hand Dominance        Extremity/Trunk Assessment   Upper Extremity Assessment: Overall WFL for tasks assessed           Lower Extremity Assessment: Overall WFL for tasks assessed      Cervical / Trunk Assessment: Normal  Communication   Communication: No difficulties  Cognition Arousal/Alertness: Awake/alert Behavior During Therapy: WFL for tasks assessed/performed Overall Cognitive Status: Within Functional Limits for tasks assessed                      General Comments      Exercises        Assessment/Plan    PT Assessment Patent does not need any further PT services  PT Diagnosis     PT Problem List    PT Treatment Interventions     PT Goals (Current goals can be found in the Care Plan section) Acute Rehab PT Goals PT Goal Formulation: No goals set, d/c therapy    Frequency     Barriers to discharge        Co-evaluation  End of Session   Activity Tolerance: Patient tolerated treatment well Patient left: in bed;with call bell/phone within reach Nurse Communication: Mobility status    Functional Assessment Tool Used: clinical judgement Functional Limitation: Mobility: Walking and moving around Mobility: Walking and Moving Around Current Status [phone]): 0 percent impaired, limited or restricted Mobility: Walking and Moving Around Goal Status [phone]): 0 percent impaired, limited or restricted Mobility: Walking and Moving Around Discharge Status [phone]): 0 percent impaired, limited or restricted    Time: 0912-0927 PT Time Calculation (min): 15 min   Charges:   PT Evaluation $Initial PT Evaluation Tier I: 1 Procedure PT Treatments $Therapeutic Activity: 8-22 mins    PT G Codes:   Functional Assessment Tool Used: clinical judgement Functional Limitation: Mobility: Walking and moving around    Melford Aase 04/16/2014, 9:33 AM  Elwyn Reach, [place]

## 2014-04-16 NOTE — Discharge Summary (Signed)
  Physician Discharge Summary  Patient ID: Mario [name] MRN: [id number] DOB/AGE: 62-Sep-1952 62 y.o.  Admit date: 04/15/2014 Discharge date: 04/16/2014  Admission Diagnoses: L2-3, L3-4 and L4-5 spinal stenosis, lumbago, lumbar radiculopathy, neurogenic claudication  Discharge Diagnoses: The same Active Problems:   Lumbar spinal stenosis   Discharged Condition: good  Hospital Course: I performed a L3 and L4 laminectomy with laminotomies at L2 to decompress the bilateral L3, L4 and L5 nerve roots on the patient on 04/15/2014. The surgery went well.  The patient's postoperative course was remarkable for urinary retention. He had to be catheterized on the evening of surgery. He has a history of postoperative urinary retention. The patient is to be started on Flomax. If his urinary retention resolved he will be discharged home as requested. The patient, and his wife, will give her oral and written discharge instructions. All their questions were answered.  Consults: None Significant Diagnostic Studies: None Treatments: L3 and L4 laminectomy to decompress the bilateral L3, L4 and L5 nerve roots using microdissection Discharge Exam: Blood pressure 93/57, pulse 65, temperature 98.8 F (37.1 C), temperature source Oral, resp. rate 18, height 5\' 10"  (1.778 m), weight 99.746 kg (219 lb 14.4 oz), SpO2 98.00%. The patient is alert and pleasant. His strength is normal in his lower extremities. His dressing is clean and dry. He looks well.  Disposition: Home if the urinary retention resolved.     Medication List         ADVIL PM PO  Take 2 tablets by mouth at bedtime.     aspirin EC 325 MG tablet  Take 325 mg by mouth daily.     atorvastatin 40 MG tablet  Commonly known as:  LIPITOR  Take 40 mg by mouth at bedtime.     benazepril 5 MG tablet  Commonly known as:  LOTENSIN  Take 5 mg by mouth daily.     diazepam 5 MG tablet  Commonly known as:  VALIUM  Take 1 tablet (5 mg total) by  mouth every 6 (six) hours as needed for muscle spasms.     DSS 100 MG Caps  Take 100 mg by mouth 2 (two) times daily.     Fish Oil 1200 MG Caps  Take 2,400 mg by mouth daily.     glimepiride 2 MG tablet  Commonly known as:  AMARYL  Take 2 mg by mouth 2 (two) times daily.     metFORMIN 1000 MG tablet  Commonly known as:  GLUCOPHAGE  Take 1,000 mg by mouth 2 (two) times daily with a meal.     metroNIDAZOLE 0.75 % cream  Commonly known as:  METROCREAM  Apply 1 application topically 2 (two) times daily. To face     OCUVITE PO  Take 1 tablet by mouth daily.     oxyCODONE-acetaminophen 10-325 MG per tablet  Commonly known as:  PERCOCET  Take 1 tablet by mouth every 4 (four) hours as needed for pain.         SignedOphelia Charter 04/16/2014, 7:18 AM

## 2014-04-16 NOTE — Progress Notes (Signed)
Patient having difficulty urinating, bladder scan showed PVR of 517 ml. In and out cath done and drained 571ml  amber, clear urine.

## 2014-04-16 NOTE — Progress Notes (Signed)
Pt. Alert and oriented,follows simple instructions, denies pain. Incision area without swelling, redness or S/S of infection. Voiding adequate clear yellow urine. Moving all extremities well and vitals stable and documented. Patient discharged home with spouse. Lumbar surgery notes instructions given to patient and family member for home safety and precautions. Pain medication given to patient prior to discharge home.  Pt. and family stated understanding of instructions given.

## 2016-09-23 ENCOUNTER — Ambulatory Visit (HOSPITAL_COMMUNITY): Payer: BLUE CROSS/BLUE SHIELD | Attending: Family Medicine | Admitting: Physical Therapy

## 2016-09-23 DIAGNOSIS — T23011S Burn of unspecified degree of right thumb (nail), sequela: Secondary | ICD-10-CM | POA: Insufficient documentation

## 2016-09-23 DIAGNOSIS — T23021S Burn of unspecified degree of single right finger (nail) except thumb, sequela: Secondary | ICD-10-CM | POA: Insufficient documentation

## 2016-09-23 DIAGNOSIS — X58XXXS Exposure to other specified factors, sequela: Secondary | ICD-10-CM | POA: Insufficient documentation

## 2016-09-23 DIAGNOSIS — M6281 Muscle weakness (generalized): Secondary | ICD-10-CM

## 2016-09-23 NOTE — Therapy (Signed)
[place], [place], [address] Phone: [phone]   Fax:  [phone]  Wound Care Evaluation  Patient Details  Name: Mario [name] MRN: [id number] Date of Birth: [birth year] No Data Recorded  Encounter Date: 09/23/2016      PT End of Session - 09/23/16 1816    Visit Number 1   Number of Visits 12   Date for PT Re-Evaluation 10/21/16   Authorization Type [employer]    Authorization Time Period 09/23/16 to 11/04/16   Authorization - Visit Number 1   Authorization - Number of Visits 10   PT Start Time 1603   PT Stop Time 1700   PT Time Calculation (min) 57 min   Activity Tolerance Patient tolerated treatment well   Behavior During Therapy [facility] for tasks assessed/performed      Past Medical History:  Diagnosis Date  . Chronic back pain    radiculopatly  and stenosis  . Complication of anesthesia    woke up being very agitated  . Diabetes mellitus without complication    takes Metformin and Amaryl daily  . History of gout 72yrs ago  . History of kidney stones 30+yrs ago  . History of shingles 1990  . Hyperlipidemia    takes Atorvastatin daily  . Rosacea    uses Metronidazole daily    Past Surgical History:  Procedure Laterality Date  . CERVICAL FUSION  1998  . COLONOSCOPY    . LUMBAR LAMINECTOMY/DECOMPRESSION MICRODISCECTOMY N/A 04/15/2014   Procedure: LUMBAR LAMINECTOMY/DECOMPRESSION MICRODISCECTOMY 2 LEVELS  lumbar three/four, four/five  ;  Surgeon: Ophelia Charter, MD;  Location: [facility];  Service: Neurosurgery;  Laterality: N/A;  . TONSILLECTOMY  1958    There were no vitals filed for this visit.         Wound Therapy - 09/23/16 1758    Subjective Patient states he was cleaning at home and ended up dipping his R hand in bleach. He felt burning and washed his hand/put lotion on burns, but 2 hours later and overnight the burns continued to get worse and blisteres appeared. [facility] went to the  [facility], where he was treated for wounds and then referred to PT wound clinic.    Patient and Family Stated Goals wound resolution    Date of Onset 09/17/16   Prior Treatments wound care from MD    Pain Assessment 0-10   Pain Score 3    Pain Type Acute pain   Pain Location Hand   Pain Orientation Right   Evaluation and Treatment Procedures Explained to Patient/Family Yes   Evaluation and Treatment Procedures agreed to   Wound Properties Date First Assessed: 09/23/16 Wound Type: Burn Location: Other (Comment) , R posterior 2nd finger   Location Orientation: Right;Posterior Wound Description (Comments): R posterior 2nd finger  Present on Admission: Yes   Dressing Type Gauze (Comment)   Dressing Changed New   Dressing Status Clean;Dry   Dressing Change Frequency PRN   % Wound base Red or Granulating 100%   % Wound base Yellow/Fibrinous Exudate 0%   Wound Length (cm) 0.3 cm   Wound Width (cm) 1 cm   Wound Depth (cm) --  skin level    Closure None   Drainage Amount Minimal   Treatment Packing (Dry gauze);Cleansed;Other (Comment)  xeroform, gauze wrapping    Wound Properties Date First Assessed: 09/23/16 Wound Type: Burn Location: Other (Comment) , R posterior 3rd finger   Location Orientation: Right;Posterior ,  R posterior 3rd finger   Wound Description (Comments): R posterior 3rd finger  Present on Admission: Yes   Dressing Type Gauze (Comment)   Dressing Changed New   Dressing Status Clean;Dry   % Wound base Red or Granulating 50%   % Wound base Yellow/Fibrinous Exudate 50%   Wound Length (cm) 1 cm   Wound Width (cm) 1.6 cm   Drainage Amount Minimal   Drainage Description Serous   Treatment Cleansed;Debridement (Selective);Other (Comment)  medihoney, gauze wrap    Wound Properties Date First Assessed: 09/23/16 Wound Type: Burn Location: Other (Comment) , R posterior 4th finger   Location Orientation: Right;Posterior Wound Description (Comments): R posterior 4th finger  Present  on Admission: Yes   Dressing Type Gauze (Comment)   Dressing Changed Changed   Dressing Status Clean;Dry   Dressing Change Frequency PRN   % Wound base Red or Granulating 0%   % Wound base Yellow/Fibrinous Exudate 100%   Wound Length (cm) 0.6 cm   Wound Width (cm) 1 cm   Closure None   Drainage Amount Scant   Drainage Description Serous   Treatment Cleansed;Debridement (Selective);Other (Comment)  medihoney, gauze wrap    Wound Properties Date First Assessed: 09/23/16 Wound Type: Burn Location: Other (Comment) , R posterior 5th finger   Location Orientation: Right;Posterior Wound Description (Comments): R posterior 5th finger  Present on Admission: Yes   Dressing Type Gauze (Comment)   Dressing Changed Changed   Dressing Status Clean;Dry   Dressing Change Frequency PRN   % Wound base Red or Granulating 60%   % Wound base Yellow/Fibrinous Exudate 40%   Wound Length (cm) 1 cm   Wound Width (cm) 1 cm   Closure None   Drainage Amount Scant   Drainage Description Serous   Treatment Cleansed;Debridement (Selective);Other (Comment)  xeroform, gauze wrap    Wound Properties Date First Assessed: 09/23/16 Wound Type: Burn Location: Other (Comment) , R anterior thumb   Location Orientation: Right;Anterior Wound Description (Comments): R anterior thumb  Present on Admission: Yes   Dressing Type Gauze (Comment)   Dressing Changed Changed   Dressing Status Clean;Dry   Dressing Change Frequency PRN   % Wound base Red or Granulating 100%   % Wound base Yellow/Fibrinous Exudate 0%   Wound Length (cm) 2.2 cm   Wound Width (cm) 1.2 cm   Margins Unattached edges (unapproximated)   Closure None   Drainage Amount None   Treatment Cleansed;Other (Comment)  xeroform, gauze wrap    Wound Properties Date First Assessed: 09/23/16 Wound Type: Burn Location: Other (Comment) , R anterior 2nd finger   Location Orientation: Right;Anterior Wound Description (Comments): R anterior second finger  Present on  Admission: Yes   Dressing Type Gauze (Comment)   Dressing Changed Changed   Dressing Status Dry;Clean   Dressing Change Frequency PRN   % Wound base Red or Granulating 100%   % Wound base Yellow/Fibrinous Exudate 0%   Wound Length (cm) 1.6 cm   Wound Width (cm) 0.5 cm   Closure None   Drainage Amount None   Treatment Cleansed;Other (Comment)  xeroform, gauze wrap    Wound Properties Date First Assessed: 09/23/16 Wound Type: Burn Location: Other (Comment) , R anterior 3rd finger   Location Orientation: Right;Anterior Wound Description (Comments): R anterior 3rd finger  Present on Admission: Yes   Dressing Type Gauze (Comment)   Dressing Changed New   Dressing Status Clean;Dry   Dressing Change Frequency PRN   % Wound base Red  or Granulating 100%   % Wound base Yellow/Fibrinous Exudate 0%   Wound Length (cm) 1 cm   Wound Width (cm) 1.5 cm   Closure None   Drainage Amount None   Treatment Cleansed;Other (Comment)  xeroform, gauze wrap    Wound Properties Date First Assessed: 09/23/16 Wound Type: Burn Location: Other (Comment) , R anterior 4th finger   Location Orientation: Right;Anterior Wound Description (Comments): R anterior 4th finger  Present on Admission: Yes   Dressing Type Gauze (Comment)   Dressing Changed Changed   Dressing Status Clean;Dry   Dressing Change Frequency PRN   % Wound base Red or Granulating 40%   % Wound base Yellow/Fibrinous Exudate 10%   % Wound base Other/Granulation Tissue (Comment) 50%  brown eschar    Wound Length (cm) 2.6 cm   Wound Width (cm) 2 cm   Closure None   Drainage Amount Scant   Drainage Description Serosanguineous   Treatment Cleansed;Debridement (Selective);Other (Comment)  medihoney, gauze wrap    Wound Properties Date First Assessed: 09/23/16 Wound Type: Burn Location: Other (Comment) , R anterior 5th finger   Location Orientation: Right;Anterior Wound Description (Comments): R anterior 5th finger  Present on Admission: Yes    Dressing Type Gauze (Comment)   Dressing Changed Changed   Dressing Status Clean;Dry   Dressing Change Frequency PRN   % Wound base Red or Granulating 100%   Wound Length (cm) 2 cm   Wound Width (cm) 1.8 cm   Closure None   Drainage Amount None   Treatment Cleansed;Other (Comment)  xeroform, gauze wrap    Selective Debridement - Location slough and eschar on finger wounds    Selective Debridement - Tools Used Forceps   Selective Debridement - Tissue Removed small amount of slough    Wound Therapy - Clinical Statement Patient arrives approximately 6 days after getting a chemical burn from cleaning agent. Upon examination some of his wounds appear to be healing well, however others do have some adherent slough and 4th R finger does have quite a large brown eschar. He is supposed to be putting Bactrin from his MD on wounds but forgot it today, so applied medihoney and xeroform and instructed patient to bring Bactrin to wound clinic in the future. His wife is an Therapist, sports and comfortable changing dressings, and instructed him to have her do a dressing chagne/apply Bactrin on Saturday. He will benefit from skilled PT services to address wounds and facilitate full wound healing with minimal risk of infection/complication risk.    Wound Therapy - Functional Problem List open hand wounds/difficulty using R hand    Factors Delaying/Impairing Wound Healing Diabetes Mellitus;Polypharmacy   Wound Therapy - Frequency Other (comment)  2x/week    Wound Therapy - Current Recommendations PT   Wound Plan Bactrin to wounds; gauze/coban wrapping for protection                          PT Education - 09/23/16 1816    Education provided Yes   Education Details prognosis, POC; difference between healthy tissue/wound bed and slough/escar. Have his RN wife remove dressing, apply Jorge Mandril, redress on Saturday.    Person(s) Educated Patient   Methods Explanation   Comprehension Verbalized understanding           PT Short Term Goals - 09/23/16 1817      PT SHORT TERM GOAL #1   Title All wounds to present as being 100% red granulating tissue in  order to show general improvement of condition    Time 3   Period Weeks   Status New     PT SHORT TERM GOAL #2   Title All wounds to have decreased in size by at least 50% in order to show general improvement of condition    Time 3   Period Weeks   Status New           PT Long Term Goals - 09/23/16 1818      PT LONG TERM GOAL #1   Title All wounds to have decreased in size by at least 75% in order to show general improvement of condition    Time 6   Period Weeks   Status New     PT LONG TERM GOAL #2   Title Patient/his wife to be independent in appropriately dressing/caring for appropriate wound areas to faciltiate full wound healing on an independent basis    Time 6   Period Weeks   Status New     PT LONG TERM GOAL #3   Title Patient to be independent in grip strengthenign exercises to promote full R hand use after wound healing    Time 6   Period Weeks   Status New            Patient will benefit from skilled therapeutic intervention in order to improve the following deficits and impairments:     Visit Diagnosis: Burn of right thumb, unspecified burn degree, sequela - Plan: PT plan of care cert/re-cert  Burn of fingers, right, unspecified degree, sequela - Plan: PT plan of care cert/re-cert  Muscle weakness (generalized) - Plan: PT plan of care cert/re-cert      G-Codes - [id number] 1822    Functional Assessment Tool Used Based on skilled clincial assessment of wound healing status, hand use, pain patterns    Functional Limitation Other PT primary   Other PT Primary Current Status [id number]) At least 60 percent but less than 80 percent impaired, limited or restricted   Other PT Primary Goal Status [id number]) At least 40 percent but less than 60 percent impaired, limited or restricted      Problem List Patient  Active Problem List   Diagnosis Date Noted  . Lumbar spinal stenosis 04/15/2014    Deniece Ree PT, DPT [facility] [address] [place], [place], [address] Phone: [phone]   Fax:  [phone]  Name: Mario [name] MRN: [id number] Date of Birth: 10/05/1951

## 2016-09-28 ENCOUNTER — Ambulatory Visit (HOSPITAL_COMMUNITY): Payer: BLUE CROSS/BLUE SHIELD | Attending: Family Medicine | Admitting: Physical Therapy

## 2016-09-28 DIAGNOSIS — M6281 Muscle weakness (generalized): Secondary | ICD-10-CM | POA: Insufficient documentation

## 2016-09-28 DIAGNOSIS — T23011S Burn of unspecified degree of right thumb (nail), sequela: Secondary | ICD-10-CM | POA: Diagnosis present

## 2016-09-28 DIAGNOSIS — T23021S Burn of unspecified degree of single right finger (nail) except thumb, sequela: Secondary | ICD-10-CM | POA: Insufficient documentation

## 2016-09-28 NOTE — Therapy (Signed)
[place], [place], [address] Phone: [phone]   Fax:  [phone]  Wound Care Therapy  Patient Details  Name: Mario [name] MRN: [id number] Date of Birth: 01/22/1951 No Data Recorded  Encounter Date: 09/28/2016      PT End of Session - 09/28/16 1108    Visit Number 2   Number of Visits 12   Date for PT Re-Evaluation 10/21/16   Authorization Type A[employer]    Authorization Time Period 09/23/16 to 11/04/16   Authorization - Visit Number 2   Authorization - Number of Visits 10   PT Start Time 0900   PT Stop Time 1015   PT Time Calculation (min) 75 min   Activity Tolerance Patient tolerated treatment well   Behavior During Therapy [facility] for tasks assessed/performed      Past Medical History:  Diagnosis Date  . Chronic back pain    radiculopatly  and stenosis  . Complication of anesthesia    woke up being very agitated  . Diabetes mellitus without complication    takes Metformin and Amaryl daily  . History of gout 32yrs ago  . History of kidney stones 30+yrs ago  . History of shingles 1990  . Hyperlipidemia    takes Atorvastatin daily  . Rosacea    uses Metronidazole daily    Past Surgical History:  Procedure Laterality Date  . CERVICAL FUSION  1998  . COLONOSCOPY    . LUMBAR LAMINECTOMY/DECOMPRESSION MICRODISCECTOMY N/A 04/15/2014   Procedure: LUMBAR LAMINECTOMY/DECOMPRESSION MICRODISCECTOMY 2 LEVELS  lumbar three/four, four/five  ;  Surgeon: Ophelia Charter, MD;  Location: [place] [facility];  Service: Neurosurgery;  Laterality: N/A;  . TONSILLECTOMY  1958    There were no vitals filed for this visit.                  Wound Therapy - 09/28/16 1050    Subjective Patient states that his wife redressed his hand on Sunday due to the bandages coming off.     Patient and Family Stated Goals wound resolution    Date of Onset 09/17/16   Prior Treatments wound care from MD    Pain Assessment 0-10    Pain Score 8   with debridement   Pain Type Acute pain   Pain Location Hand   Pain Orientation Right   Evaluation and Treatment Procedures Explained to Patient/Family Yes   Evaluation and Treatment Procedures agreed to   Wound Properties Date First Assessed: 09/23/16 Wound Type: Burn Location: Other (Comment) , R posterior 2nd finger   Location Orientation: Right;Posterior Wound Description (Comments): R posterior 2nd finger  Present on Admission: Yes Final Assessment Date: 09/28/16 Final Assessment Time: 0949   Dressing Type (P)  Gauze (Comment)   Dressing Status (P)  Clean;Dry   Dressing Change Frequency (P)  PRN   % Wound base Red or Granulating (P)  100%   % Wound base Yellow/Fibrinous Exudate (P)  0%   Closure (P)  None   Drainage Amount (P)  Minimal   Wound Properties Date First Assessed: 09/23/16 Wound Type: Burn Location: Other (Comment) , R posterior 3rd finger   Location Orientation: Right;Posterior , R posterior 3rd finger   Wound Description (Comments): R posterior 3rd finger  Present on Admission: Yes   Dressing Type Gauze (Comment);Impregnated gauze (bismuth)   Dressing Changed Changed   Dressing Status Clean;Dry   % Wound base Red or Granulating 0%   % Wound  base Yellow/Fibrinous Exudate --   % Wound base Black/Eschar 100%  yellow    Drainage Amount Minimal   Drainage Description Serous   Treatment Cleansed;Debridement (Selective)   Wound Properties Date First Assessed: 09/23/16 Wound Type: Burn Location: Other (Comment) , R posterior 4th finger   Location Orientation: Right;Posterior Wound Description (Comments): R posterior 4th finger  Present on Admission: Yes   Dressing Type Gauze (Comment);Impregnated gauze (bismuth)   Dressing Changed Changed   Dressing Status Clean;Dry   Dressing Change Frequency PRN   % Wound base Red or Granulating 0%   % Wound base Yellow/Fibrinous Exudate 100%   Closure None   Drainage Amount Scant   Drainage Description Serous    Treatment Cleansed;Debridement (Selective)   Wound Properties Date First Assessed: 09/23/16 Wound Type: Burn Location: Other (Comment) , R posterior 5th finger   Location Orientation: Right;Posterior Wound Description (Comments): R posterior 5th finger  Present on Admission: Yes   Dressing Type Gauze (Comment);Impregnated gauze (bismuth)   Dressing Changed Changed   Dressing Status Clean;Dry   Dressing Change Frequency PRN   % Wound base Red or Granulating 10%   % Wound base Yellow/Fibrinous Exudate 90%   Peri-wound Assessment Maceration   Closure None   Drainage Amount Scant   Drainage Description Serous   Treatment Cleansed;Debridement (Selective)   Wound Properties Date First Assessed: 09/23/16 Wound Type: Burn Location: Other (Comment) , R anterior thumb   Location Orientation: Right;Anterior Wound Description (Comments): R anterior thumb  Present on Admission: Yes Final Assessment Date: 09/28/16 Final Assessment Time: 0933   Dressing Type (P)  Gauze (Comment)   Dressing Status (P)  Clean;Dry   Dressing Change Frequency (P)  PRN   % Wound base Red or Granulating (P)  100%   % Wound base Yellow/Fibrinous Exudate (P)  0%   Margins (P)  Unattached edges (unapproximated)   Closure (P)  None   Drainage Amount (P)  None   Wound Properties Date First Assessed: 09/23/16 Wound Type: Burn Location: Other (Comment) , R anterior 2nd finger   Location Orientation: Right;Anterior Wound Description (Comments): R anterior second finger  Present on Admission: Yes Final Assessment Date: 09/28/16 Final Assessment Time: 0950   Dressing Type (P)  Gauze (Comment)   Dressing Status (P)  Dry;Clean   Dressing Change Frequency (P)  PRN   % Wound base Red or Granulating (P)  100%   % Wound base Yellow/Fibrinous Exudate (P)  0%   Closure (P)  None   Drainage Amount (P)  None   Wound Properties Date First Assessed: 09/23/16 Wound Type: Burn Location: Other (Comment) , R anterior 3rd finger   Location  Orientation: Right;Anterior Wound Description (Comments): R anterior 3rd finger  Present on Admission: Yes   Dressing Type Gauze (Comment)   Dressing Status Clean;Dry   Dressing Change Frequency PRN   % Wound base Red or Granulating 100%   % Wound base Yellow/Fibrinous Exudate 0%   Closure None   Drainage Amount None   Treatment Cleansed;Debridement (Selective)   Wound Properties Date First Assessed: 09/23/16 Wound Type: Burn Location: Other (Comment) , R anterior 4th finger   Location Orientation: Right;Anterior Wound Description (Comments): R anterior 4th finger  Present on Admission: Yes   Dressing Type Gauze (Comment)   Dressing Changed Changed   Dressing Status Clean;Dry   Dressing Change Frequency PRN   % Wound base Red or Granulating 40%   % Wound base Yellow/Fibrinous Exudate 10%   % Wound base  Other/Granulation Tissue (Comment) 50%  brown eschar    Closure None   Drainage Amount Scant   Drainage Description Serosanguineous   Treatment Cleansed;Debridement (Selective)   Wound Properties Date First Assessed: 09/23/16 Wound Type: Burn Location: Other (Comment) , R anterior 5th finger   Location Orientation: Right;Anterior Wound Description (Comments): R anterior 5th finger  Present on Admission: Yes   Dressing Type Gauze (Comment)   Dressing Status Clean;Dry   Dressing Change Frequency PRN   % Wound base Red or Granulating 100%   Closure None   Drainage Amount None   Treatment Cleansed;Debridement (Selective)   Selective Debridement - Location slough and eschar on finger wounds    Selective Debridement - Tools Used Forceps;Scalpel;Scissors   Selective Debridement - Tissue Removed small amount of slough    Wound Therapy - Clinical Statement Pt thumb and index finger able to go without dressing after debridement.  Pt continues to have adherent eshar on dorsal aspct of middle, ring and little finger.  Dressing changed to xeroform, 2x2 and kling secondary to maceration of skin.  Pt  became light headed and needed to lie down during debridement therefore would recommend that pt position during treatment be semi-supine and not sitting.     Wound Therapy - Functional Problem List open hand wounds/difficulty using R hand    Factors Delaying/Impairing Wound Healing Diabetes Mellitus;Polypharmacy   Hydrotherapy Plan Debridement;Dressing change;Patient/family education   Wound Therapy - Frequency Other (comment)  2x/week    Wound Therapy - Current Recommendations PT   Wound Plan --   Dressing  Polyantibacerial cream placed on eschar area after blading with scapel, all other areas covered with xeroform 2x2 and kling with netting used to keep bandages in place.  Pt thumb and index finger were not bandaged and will no longer need treatment.                     PT Short Term Goals - 09/28/16 1110      PT SHORT TERM GOAL #1   Title All wounds to present as being 100% red granulating tissue in order to show general improvement of condition    Time 3   Period Weeks   Status On-going     PT SHORT TERM GOAL #2   Title All wounds to have decreased in size by at least 50% in order to show general improvement of condition    Time 3   Period Weeks   Status On-going           PT Long Term Goals - 09/28/16 1110      PT LONG TERM GOAL #1   Title All wounds to have decreased in size by at least 75% in order to show general improvement of condition    Time 6   Period Weeks   Status On-going     PT LONG TERM GOAL #2   Title Patient/his wife to be independent in appropriately dressing/caring for appropriate wound areas to faciltiate full wound healing on an independent basis    Time 6   Period Weeks   Status On-going     PT LONG TERM GOAL #3   Title Patient to be independent in grip strengthenign exercises to promote full R hand use after wound healing    Time 6   Period Weeks   Status On-going               Plan - 09/28/16 1109    Clinical  Impression Statement as above    Rehab Potential Good   PT Frequency 2x / week   PT Duration 6 weeks   PT Treatment/Interventions ADLs/Self Care Home Management;Other (comment);Patient/family education;Therapeutic exercise   PT Next Visit Plan make sure pt is completing ROM with hand and fingers.    Consulted and Agree with Plan of Care Patient;Family member/caregiver   Family Member Consulted wifte      Patient will benefit from skilled therapeutic intervention in order to improve the following deficits and impairments:  Pain, Decreased range of motion  Visit Diagnosis: Burn of fingers, right, unspecified degree, sequela  Burn of right thumb, unspecified burn degree, sequela     Problem List Patient Active Problem List   Diagnosis Date Noted  . Lumbar spinal stenosis 04/15/2014    Rayetta Humphrey, PT CLT [phone] 09/28/2016, 11:11 AM  [place], [place], [address] Phone: [phone]   Fax:  [phone]  Name: Mario [name] MRN: [id number] Date of Birth: 12/29/1950

## 2016-09-30 ENCOUNTER — Ambulatory Visit (HOSPITAL_COMMUNITY): Payer: BLUE CROSS/BLUE SHIELD | Admitting: Physical Therapy

## 2016-09-30 DIAGNOSIS — T23011S Burn of unspecified degree of right thumb (nail), sequela: Secondary | ICD-10-CM

## 2016-09-30 DIAGNOSIS — T23021S Burn of unspecified degree of single right finger (nail) except thumb, sequela: Secondary | ICD-10-CM

## 2016-09-30 DIAGNOSIS — M6281 Muscle weakness (generalized): Secondary | ICD-10-CM

## 2016-09-30 NOTE — Therapy (Signed)
[facility] [address] [place], [place], [address] Phone: [phone]   Fax:  [phone]  Physical Therapy Treatment  Patient Details  Name: Mario [name] MRN: [id number] Date of Birth: 06/26/1951 No Data Recorded  Encounter Date: 09/30/2016      PT End of Session - 09/30/16 1528    Visit Number 3   Number of Visits 12   Date for PT Re-Evaluation 10/21/16   Authorization Type AARP    Authorization Time Period 09/23/16 to 11/04/16   Authorization - Visit Number 3   Authorization - Number of Visits 10   PT Start Time [id number]   PT Stop Time 1445   PT Time Calculation (min) 30 min   Activity Tolerance Patient tolerated treatment well   Behavior During Therapy [facility] for tasks assessed/performed      Past Medical History:  Diagnosis Date  . Chronic back pain    radiculopatly  and stenosis  . Complication of anesthesia    woke up being very agitated  . Diabetes mellitus without complication    takes Metformin and Amaryl daily  . History of gout 46yrs ago  . History of kidney stones 30+yrs ago  . History of shingles 1990  . Hyperlipidemia    takes Atorvastatin daily  . Rosacea    uses Metronidazole daily    Past Surgical History:  Procedure Laterality Date  . CERVICAL FUSION  1998  . COLONOSCOPY    . LUMBAR LAMINECTOMY/DECOMPRESSION MICRODISCECTOMY N/A 04/15/2014   Procedure: LUMBAR LAMINECTOMY/DECOMPRESSION MICRODISCECTOMY 2 LEVELS  lumbar three/four, four/five  ;  Surgeon: Ophelia Charter, MD;  Location: [place] [facility];  Service: Neurosurgery;  Laterality: N/A;  . TONSILLECTOMY  1958    There were no vitals filed for this visit.                     Wound Therapy - 09/30/16 1517    Subjective Pt reports overall improvement with new wrapping.  States it is staying on better and not hurting as much.   Patient and Family Stated Goals wound resolution    Date of Onset 09/17/16   Prior Treatments wound care  from MD    Pain Assessment No/denies pain  unless hit or messed with then increases to 6/10   Evaluation and Treatment Procedures Explained to Patient/Family Yes   Evaluation and Treatment Procedures agreed to   Wound Properties Date First Assessed: 09/23/16 Wound Type: Burn Location: Other (Comment) , R posterior 3rd finger   Location Orientation: Right , R posterior 3rd finger   Wound Description (Comments): R dorsal 3rd finger  Present on Admission: Yes   Dressing Type Gauze (Comment);Impregnated gauze (bismuth)   Dressing Changed Changed   Dressing Status Clean;Dry   % Wound base Red or Granulating 0%   % Wound base Black/Eschar 100%  yellow    Drainage Amount Scant   Drainage Description Serous   Treatment Cleansed;Debridement (Selective)   Wound Properties Date First Assessed: 09/23/16 Wound Type: Burn Location: Other (Comment) , R posterior 4th finger   Location Orientation: Right Wound Description (Comments): R dorsal 4th finger  Present on Admission: Yes   Dressing Type Gauze (Comment);Impregnated gauze (bismuth)   Dressing Changed Changed   Dressing Status Clean;Dry   Dressing Change Frequency PRN   % Wound base Red or Granulating 0%   % Wound base Yellow/Fibrinous Exudate 100%   Closure None   Drainage Amount Scant   Drainage Description  Serous   Treatment Cleansed;Debridement (Selective)   Wound Properties Date First Assessed: 09/23/16 Wound Type: Burn Location: Other (Comment) , R posterior 5th finger   Location Orientation: Right Wound Description (Comments): Rt dorsal 5th finger  Present on Admission: Yes   Dressing Type Gauze (Comment);Impregnated gauze (bismuth)   Dressing Changed Changed   Dressing Status Clean;Dry   Dressing Change Frequency PRN   % Wound base Red or Granulating 100%   % Wound base Yellow/Fibrinous Exudate 0%   Peri-wound Assessment --   Closure None   Drainage Amount None   Drainage Description --   Treatment Cleansed   Wound Properties Date  First Assessed: 09/23/16 Wound Type: Burn Location: Other (Comment) , R anterior 3rd finger   Location Orientation: Right;Anterior Wound Description (Comments): Rt 3rd fingertip Present on Admission: Yes   Dressing Type Gauze (Comment)   Dressing Changed Changed   Dressing Status Clean;Dry   Dressing Change Frequency PRN   % Wound base Red or Granulating 100%   % Wound base Yellow/Fibrinous Exudate 0%   Closure None   Drainage Amount None   Treatment Cleansed   Wound Properties Date First Assessed: 09/23/16 Wound Type: Burn Location: Other (Comment) , R anterior 4th finger   Location Orientation: Right;Anterior Wound Description (Comments): Rt  4th fingertip  Present on Admission: Yes   Dressing Type Gauze (Comment)   Dressing Changed Changed   Dressing Status Clean;Dry   Dressing Change Frequency PRN   % Wound base Red or Granulating 0%   % Wound base Yellow/Fibrinous Exudate 100%   % Wound base Other/Granulation Tissue (Comment) --  brown eschar    Closure None   Drainage Amount Scant   Drainage Description Serosanguineous   Treatment Cleansed  too tender to debride   Wound Properties Date First Assessed: 09/23/16 Wound Type: Burn Location: Other (Comment) , R anterior 5th finger   Location Orientation: Right;Anterior Wound Description (Comments): R anterior 5th finger  Present on Admission: Yes   Dressing Type Gauze (Comment)   Dressing Status Clean;Dry   Dressing Change Frequency PRN   % Wound base Red or Granulating 100%   Closure None   Drainage Amount None   Treatment Cleansed   Selective Debridement - Location slough and eschar on finger wounds    Selective Debridement - Tools Used Forceps;Scalpel;Scissors   Selective Debridement - Tissue Removed small amount of slough    Wound Therapy - Clinical Statement Overall improvement in wounds with 5th finger now healed other than slight raw area on fingertip.  Identical areas of 100% thick adherent eschar on dorsal 3rd and 4th  fingers.  Able to debride 10% of 4th finger but continued adherence.  Also with thick adherent slough on fourth fingertip and unable to debride due to extreme tightness.  Changed dressing on these three areas to medihoney colloid.  continued with xeroform on pinky finger.  All others doing better.     Wound Therapy - Functional Problem List open hand wounds/difficulty using R hand    Factors Delaying/Impairing Wound Healing Diabetes Mellitus;Polypharmacy   Hydrotherapy Plan Debridement;Dressing change;Patient/family education   Wound Therapy - Frequency Other (comment)  2x/week    Wound Therapy - Current Recommendations PT   Dressing  medihoney colloid on dorsum of 3rd and 4th fingers and fingertip of fourth finger.  Other areas with xeroform                    PT Short Term Goals - 09/28/16 1110  PT SHORT TERM GOAL #1   Title All wounds to present as being 100% red granulating tissue in order to show general improvement of condition    Time 3   Period Weeks   Status On-going     PT SHORT TERM GOAL #2   Title All wounds to have decreased in size by at least 50% in order to show general improvement of condition    Time 3   Period Weeks   Status On-going           PT Long Term Goals - 09/28/16 1110      PT LONG TERM GOAL #1   Title All wounds to have decreased in size by at least 75% in order to show general improvement of condition    Time 6   Period Weeks   Status On-going     PT LONG TERM GOAL #2   Title Patient/his wife to be independent in appropriately dressing/caring for appropriate wound areas to faciltiate full wound healing on an independent basis    Time 6   Period Weeks   Status On-going     PT LONG TERM GOAL #3   Title Patient to be independent in grip strengthenign exercises to promote full R hand use after wound healing    Time 6   Period Weeks   Status On-going             Patient will benefit from skilled therapeutic  intervention in order to improve the following deficits and impairments:     Visit Diagnosis: Burn of fingers, right, unspecified degree, sequela  Burn of right thumb, unspecified burn degree, sequela  Muscle weakness (generalized)     Problem List Patient Active Problem List   Diagnosis Date Noted  . Lumbar spinal stenosis 04/15/2014    Teena Irani, PTA/CLT [phone]  09/30/2016, 3:29 PM  [facility] [address] [place], [place], [address] Phone: [phone]   Fax:  [phone]  Name: Mario [name] MRN: [id number] Date of Birth: 10-27-1950

## 2016-10-05 ENCOUNTER — Ambulatory Visit (HOSPITAL_COMMUNITY): Payer: BLUE CROSS/BLUE SHIELD | Admitting: Physical Therapy

## 2016-10-05 DIAGNOSIS — T23011S Burn of unspecified degree of right thumb (nail), sequela: Secondary | ICD-10-CM

## 2016-10-05 DIAGNOSIS — M6281 Muscle weakness (generalized): Secondary | ICD-10-CM

## 2016-10-05 DIAGNOSIS — T23021S Burn of unspecified degree of single right finger (nail) except thumb, sequela: Secondary | ICD-10-CM | POA: Diagnosis not present

## 2016-10-06 NOTE — Therapy (Signed)
[place], [place], [address] Phone: [phone]   Fax:  [phone]  Wound Care Therapy  Patient Details  Name: VIVIANO FLADGER MRN: [id number] Date of Birth: 07/27/1951 No Data Recorded  Encounter Date: 10/05/2016      PT End of Session - 10/06/16 0852    Visit Number 4   Number of Visits 12   Date for PT Re-Evaluation 10/21/16   Authorization Type A[employer]    Authorization Time Period 09/23/16 to 11/04/16   Authorization - Visit Number 4   Authorization - Number of Visits 10   PT Start Time [id number]   PT Stop Time 1630   PT Time Calculation (min) 25 min   Activity Tolerance Patient tolerated treatment well   Behavior During Therapy [facility] for tasks assessed/performed      Past Medical History:  Diagnosis Date  . Chronic back pain    radiculopatly  and stenosis  . Complication of anesthesia    woke up being very agitated  . Diabetes mellitus without complication    takes Metformin and Amaryl daily  . History of gout 26yrs ago  . History of kidney stones 30+yrs ago  . History of shingles 1990  . Hyperlipidemia    takes Atorvastatin daily  . Rosacea    uses Metronidazole daily    Past Surgical History:  Procedure Laterality Date  . CERVICAL FUSION  1998  . COLONOSCOPY    . LUMBAR LAMINECTOMY/DECOMPRESSION MICRODISCECTOMY N/A 04/15/2014   Procedure: LUMBAR LAMINECTOMY/DECOMPRESSION MICRODISCECTOMY 2 LEVELS  lumbar three/four, four/five  ;  Surgeon: Ophelia Charter, MD;  Location: [place] [facility];  Service: Neurosurgery;  Laterality: N/A;  . TONSILLECTOMY  1958    There were no vitals filed for this visit.                  Wound Therapy - 10/06/16 0844    Subjective Continued improvment without pain   Patient and Family Stated Goals wound resolution    Date of Onset 09/17/16   Prior Treatments wound care from MD    Pain Assessment No/denies pain   Evaluation and Treatment Procedures  Explained to Patient/Family Yes   Evaluation and Treatment Procedures agreed to   Wound Properties Date First Assessed: 09/23/16 Wound Type: Burn Location: Other (Comment) , R anterior 3rd finger   Location Orientation: Right;Anterior Wound Description (Comments): Rt 3rd fingertip Present on Admission: Yes Final Assessment Date: 10/05/16   Dressing Type --   Dressing Status --   Dressing Change Frequency --   % Wound base Red or Granulating --   % Wound base Yellow/Fibrinous Exudate --   Closure --   Drainage Amount --   Wound Properties Date First Assessed: 09/23/16 Wound Type: Burn Location: Other (Comment) , R posterior 3rd finger   Location Orientation: Right , R posterior 3rd finger   Wound Description (Comments): R dorsal 3rd finger  Present on Admission: Yes   Dressing Type Gauze (Comment);Impregnated gauze (bismuth)   Dressing Changed Changed   Dressing Status Clean;Dry   % Wound base Red or Granulating 80%   % Wound base Black/Eschar 20%  yellow    Drainage Amount Scant   Drainage Description Serous   Treatment Cleansed;Debridement (Selective)   Wound Properties Date First Assessed: 09/23/16 Wound Type: Burn Location: Other (Comment) , R posterior 4th finger   Location Orientation: Right Wound Description (Comments): R dorsal 4th finger  Present on Admission: Yes  Dressing Type Gauze (Comment);Impregnated gauze (bismuth)   Dressing Changed Changed   Dressing Status Clean;Dry   Dressing Change Frequency PRN   % Wound base Red or Granulating 0%   % Wound base Yellow/Fibrinous Exudate 100%   Closure None   Drainage Amount Scant   Drainage Description Serous   Treatment Cleansed;Debridement (Selective)   Wound Properties Date First Assessed: 09/23/16 Wound Type: Burn Location: Other (Comment) , R posterior 5th finger   Location Orientation: Right Wound Description (Comments): Rt dorsal 5th finger  Present on Admission: Yes Final Assessment Date: 10/05/16   Dressing Type (P)  --    Dressing Status (P)  --   Dressing Change Frequency (P)  --   % Wound base Red or Granulating (P)  --   % Wound base Yellow/Fibrinous Exudate (P)  --   Closure (P)  --   Drainage Amount (P)  --   Wound Properties Date First Assessed: 09/23/16 Wound Type: Burn Location: Other (Comment) , R anterior 4th finger   Location Orientation: Right;Anterior Wound Description (Comments): Rt  4th fingertip  Present on Admission: Yes   Dressing Type Gauze (Comment)   Dressing Changed Changed   Dressing Status Clean;Dry   Dressing Change Frequency PRN   % Wound base Red or Granulating 0%   % Wound base Yellow/Fibrinous Exudate 100%   % Wound base Other/Granulation Tissue (Comment) --  brown eschar    Closure None   Drainage Amount Scant   Drainage Description Serosanguineous   Treatment Cleansed;Debridement (Selective)   Wound Properties Date First Assessed: 09/23/16 Wound Type: Burn Location: Other (Comment) , R anterior 5th finger   Location Orientation: Right;Anterior Wound Description (Comments): R anterior 5th finger  Present on Admission: Yes Final Assessment Date: 10/05/16   Dressing Type (P)  --   Dressing Status (P)  --   Dressing Change Frequency (P)  --   % Wound base Red or Granulating (P)  --   Closure (P)  None   Drainage Amount (P)  None   Selective Debridement - Location slough and eschar on finger wounds    Selective Debridement - Tools Used Forceps;Scalpel;Scissors   Selective Debridement - Tissue Removed small amount of slough    Wound Therapy - Clinical Statement continued improvment with complete healing of 5th finger and fingertip of middle finger.  Ring finger (4th finger) of most concern at this point.  Eschar remains adherent.  Scored again and applied medihoney colloid to loosen eschar.  Good results with dorsum of middle finger with complete loosening and removal of eschar today via sharps. PT is tolerating treatment well without pain.     Wound Therapy - Functional  Problem List open hand wounds/difficulty using R hand    Factors Delaying/Impairing Wound Healing Diabetes Mellitus;Polypharmacy   Hydrotherapy Plan Debridement;Dressing change;Patient/family education   Wound Therapy - Frequency Other (comment)  2x/week    Wound Therapy - Current Recommendations PT   Wound Plan continue with woundcare with focus on fourth finger.  Dressings as appropriate and conducive to healing environment.    Dressing  medihoney colloid on dorsum and fingertip of 4th fingers   Dressing xeroform on dorsum of third finger                   PT Short Term Goals - 09/28/16 1110      PT SHORT TERM GOAL #1   Title All wounds to present as being 100% red granulating tissue in order to show general  improvement of condition    Time 3   Period Weeks   Status On-going     PT SHORT TERM GOAL #2   Title All wounds to have decreased in size by at least 50% in order to show general improvement of condition    Time 3   Period Weeks   Status On-going           PT Long Term Goals - 09/28/16 1110      PT LONG TERM GOAL #1   Title All wounds to have decreased in size by at least 75% in order to show general improvement of condition    Time 6   Period Weeks   Status On-going     PT LONG TERM GOAL #2   Title Patient/his wife to be independent in appropriately dressing/caring for appropriate wound areas to faciltiate full wound healing on an independent basis    Time 6   Period Weeks   Status On-going     PT LONG TERM GOAL #3   Title Patient to be independent in grip strengthenign exercises to promote full R hand use after wound healing    Time 6   Period Weeks   Status On-going             Patient will benefit from skilled therapeutic intervention in order to improve the following deficits and impairments:     Visit Diagnosis: Burn of fingers, right, unspecified degree, sequela  Burn of right thumb, unspecified burn degree, sequela  Muscle  weakness (generalized)     Problem List Patient Active Problem List   Diagnosis Date Noted  . Lumbar spinal stenosis 04/15/2014    Teena Irani, PTA/CLT [phone] 10/06/2016, 8:54 AM  [place] [address] [place], [place], [address] Phone: [phone]   Fax:  [phone]  Name: ADMA GAERTNER MRN: [id number] Date of Birth: 1951-03-14

## 2016-10-08 ENCOUNTER — Ambulatory Visit (HOSPITAL_COMMUNITY): Payer: BLUE CROSS/BLUE SHIELD

## 2016-10-08 DIAGNOSIS — T23021S Burn of unspecified degree of single right finger (nail) except thumb, sequela: Secondary | ICD-10-CM | POA: Diagnosis not present

## 2016-10-08 DIAGNOSIS — M6281 Muscle weakness (generalized): Secondary | ICD-10-CM

## 2016-10-08 DIAGNOSIS — T23011S Burn of unspecified degree of right thumb (nail), sequela: Secondary | ICD-10-CM

## 2016-10-08 NOTE — Therapy (Signed)
[place], [place], [address] Phone: [phone]   Fax:  [phone]  Wound Care Therapy  Patient Details  Name: Mario [name] MRN: [id number] Date of Birth: [birth year] No Data Recorded  Encounter Date: 10/08/2016      PT End of Session - 10/08/16 0854    Visit Number 5   Number of Visits 12   Date for PT Re-Evaluation 10/21/16   Authorization Type AARP    Authorization Time Period 09/23/16 to 11/04/16   Authorization - Visit Number 5   Authorization - Number of Visits 10   PT Start Time 0815   PT Stop Time 0845   PT Time Calculation (min) 30 min      Past Medical History:  Diagnosis Date  . Chronic back pain    radiculopatly  and stenosis  . Complication of anesthesia    woke up being very agitated  . Diabetes mellitus without complication    takes Metformin and Amaryl daily  . History of gout 65yrs ago  . History of kidney stones 30+yrs ago  . History of shingles 1990  . Hyperlipidemia    takes Atorvastatin daily  . Rosacea    uses Metronidazole daily    Past Surgical History:  Procedure Laterality Date  . CERVICAL FUSION  1998  . COLONOSCOPY    . LUMBAR LAMINECTOMY/DECOMPRESSION MICRODISCECTOMY N/A 04/15/2014   Procedure: LUMBAR LAMINECTOMY/DECOMPRESSION MICRODISCECTOMY 2 LEVELS  lumbar three/four, four/five  ;  Surgeon: Ophelia Charter, MD;  Location: [place] [facility];  Service: Neurosurgery;  Laterality: N/A;  . TONSILLECTOMY  1958    There were no vitals filed for this visit.                  Wound Therapy - 10/08/16 0847    Subjective Pt arrived to session with dressings intact, no reports of pain today   Patient and Family Stated Goals wound resolution    Date of Onset 09/17/16   Prior Treatments wound care from MD    Pain Assessment No/denies pain   Evaluation and Treatment Procedures Explained to Patient/Family Yes   Evaluation and Treatment Procedures agreed to    Wound Properties Date First Assessed: 09/23/16 Wound Type: Burn Location: Other (Comment) , R posterior 3rd finger   Location Orientation: Right , R posterior 3rd finger   Wound Description (Comments): R dorsal 3rd finger  Present on Admission: Yes   Dressing Type Gauze (Comment);Impregnated gauze (bismuth)   Dressing Changed Changed   Dressing Status Clean;Dry   % Wound base Red or Granulating 80%   % Wound base Black/Eschar 20%   Drainage Amount Scant   Drainage Description Serous   Treatment Cleansed;Debridement (Selective)   Wound Properties Date First Assessed: 09/23/16 Wound Type: Burn Location: Other (Comment) , R posterior 4th finger   Location Orientation: Right Wound Description (Comments): R dorsal 4th finger  Present on Admission: Yes   Dressing Type Gauze (Comment)  medihoney colloid   Dressing Changed Changed   Dressing Status Clean;Dry   Dressing Change Frequency PRN   % Wound base Red or Granulating 0%   % Wound base Yellow/Fibrinous Exudate 100%   Closure None   Drainage Amount Scant   Drainage Description Serous   Treatment Cleansed;Debridement (Selective)   Wound Properties Date First Assessed: 09/23/16 Wound Type: Burn Location: Other (Comment) , R anterior 4th finger   Location Orientation: Right;Anterior Wound Description (Comments): Rt  4th fingertip  Present  on Admission: Yes   Dressing Type Gauze (Comment)  medihoney colloid   Dressing Changed Changed   Dressing Status Clean;Dry   Dressing Change Frequency PRN   % Wound base Red or Granulating 0%   % Wound base Yellow/Fibrinous Exudate 100%   % Wound base Other/Granulation Tissue (Comment) --  brown eschar   Closure None   Drainage Amount Scant   Drainage Description Serosanguineous   Treatment Cleansed;Debridement (Selective)   Selective Debridement - Location slough and eschar on finger wounds    Selective Debridement - Tools Used Forceps;Scalpel;Scissors   Wound Therapy - Clinical Statement 3rd finger  almost healed.  4th finger most concern as eschar remains.  Selective debridement for removal of slough and eschar.  Continued with medihoney colloid to 4th finger and xeroform on dorsum of 3rd finger.  No reports of pain through session.     Wound Therapy - Functional Problem List open hand wounds/difficulty using R hand    Factors Delaying/Impairing Wound Healing Diabetes Mellitus;Polypharmacy   Hydrotherapy Plan Debridement;Dressing change;Patient/family education   Wound Therapy - Frequency --  2x/week   Wound Therapy - Current Recommendations PT   Wound Plan continue with woundcare with focus on fourth finger.  Dressings as appropriate and conducive to healing environment.    Dressing  medihoney colloid on dorsum and fingertip of 4th fingers   Dressing xeroform on dorsum of third finger                   PT Short Term Goals - 09/28/16 1110      PT SHORT TERM GOAL #1   Title All wounds to present as being 100% red granulating tissue in order to show general improvement of condition    Time 3   Period Weeks   Status On-going     PT SHORT TERM GOAL #2   Title All wounds to have decreased in size by at least 50% in order to show general improvement of condition    Time 3   Period Weeks   Status On-going           PT Long Term Goals - 09/28/16 1110      PT LONG TERM GOAL #1   Title All wounds to have decreased in size by at least 75% in order to show general improvement of condition    Time 6   Period Weeks   Status On-going     PT LONG TERM GOAL #2   Title Patient/his wife to be independent in appropriately dressing/caring for appropriate wound areas to faciltiate full wound healing on an independent basis    Time 6   Period Weeks   Status On-going     PT LONG TERM GOAL #3   Title Patient to be independent in grip strengthenign exercises to promote full R hand use after wound healing    Time 6   Period Weeks   Status On-going              Patient will benefit from skilled therapeutic intervention in order to improve the following deficits and impairments:     Visit Diagnosis: Burn of fingers, right, unspecified degree, sequela  Burn of right thumb, unspecified burn degree, sequela  Muscle weakness (generalized)     Problem List Patient Active Problem List   Diagnosis Date Noted  . Lumbar spinal stenosis 04/15/2014   Ihor Austin, LPTA; [place]  Aldona Lento 10/08/2016, 8:55 AM  [facility]  [place], [place], [address] Phone: [phone]   Fax:  [phone]  Name: Mario [name] MRN: [id number] Date of Birth: 1951/07/19

## 2016-10-12 ENCOUNTER — Ambulatory Visit (HOSPITAL_COMMUNITY): Payer: BLUE CROSS/BLUE SHIELD | Admitting: Physical Therapy

## 2016-10-12 DIAGNOSIS — M6281 Muscle weakness (generalized): Secondary | ICD-10-CM

## 2016-10-12 DIAGNOSIS — T23011S Burn of unspecified degree of right thumb (nail), sequela: Secondary | ICD-10-CM

## 2016-10-12 DIAGNOSIS — T23021S Burn of unspecified degree of single right finger (nail) except thumb, sequela: Secondary | ICD-10-CM | POA: Diagnosis not present

## 2016-10-12 NOTE — Therapy (Signed)
[place], [place], [address] Phone: [phone]   Fax:  [phone]  Wound Care Therapy  Patient Details  Name: Mario [name] MRN: [id number] Date of Birth: 11/25/1950 No Data Recorded  Encounter Date: 10/12/2016      PT End of Session - 10/12/16 1017    Visit Number 6   Number of Visits 12   Date for PT Re-Evaluation 10/21/16   Authorization Type AARP    Authorization Time Period 09/23/16 to 11/04/16   Authorization - Visit Number 6   Authorization - Number of Visits 10   PT Start Time 0902   PT Stop Time 0938   PT Time Calculation (min) 36 min   Activity Tolerance Patient tolerated treatment well   Behavior During Therapy [facility] for tasks assessed/performed      Past Medical History:  Diagnosis Date  . Chronic back pain    radiculopatly  and stenosis  . Complication of anesthesia    woke up being very agitated  . Diabetes mellitus without complication    takes Metformin and Amaryl daily  . History of gout 37yrs ago  . History of kidney stones 30+yrs ago  . History of shingles 1990  . Hyperlipidemia    takes Atorvastatin daily  . Rosacea    uses Metronidazole daily    Past Surgical History:  Procedure Laterality Date  . CERVICAL FUSION  1998  . COLONOSCOPY    . LUMBAR LAMINECTOMY/DECOMPRESSION MICRODISCECTOMY N/A 04/15/2014   Procedure: LUMBAR LAMINECTOMY/DECOMPRESSION MICRODISCECTOMY 2 LEVELS  lumbar three/four, four/five  ;  Surgeon: Ophelia Charter, MD;  Location: [place] [facility];  Service: Neurosurgery;  Laterality: N/A;  . TONSILLECTOMY  1958    There were no vitals filed for this visit.                  Wound Therapy - 10/12/16 0957    Subjective Patient arrives doing well, reports no major changes recently and had a good weekend. Dressings intact.    Patient and Family Stated Goals wound resolution    Date of Onset 09/17/16   Prior Treatments wound care from MD    Pain  Assessment No/denies pain   Evaluation and Treatment Procedures Explained to Patient/Family Yes   Evaluation and Treatment Procedures agreed to   Wound Properties Date First Assessed: 09/23/16 Wound Type: Burn Location: Other (Comment) , R posterior 3rd finger   Location Orientation: Right , R posterior 3rd finger   Wound Description (Comments): R dorsal 3rd finger  Present on Admission: Yes   Dressing Type Gauze (Comment);Impregnated gauze (bismuth)   Dressing Changed Changed   Dressing Status Clean;Dry   % Wound base Red or Granulating 90%   % Wound base Black/Eschar 10%   Drainage Amount Scant   Drainage Description Serous   Treatment Cleansed;Packing (Impregnated strip);Other (Comment)  cleansed, xeroform    Wound Properties Date First Assessed: 09/23/16 Wound Type: Burn Location: Other (Comment) , R posterior 4th finger   Location Orientation: Right Wound Description (Comments): R dorsal 4th finger  Present on Admission: Yes   Dressing Type Gauze (Comment)   Dressing Changed Changed   Dressing Status Clean;Dry   Dressing Change Frequency PRN   Closure None   Drainage Amount Scant   Drainage Description Serous   Treatment Cleansed;Packing (Impregnated strip);Other (Comment)  cleansed, xeroform    Wound Properties Date First Assessed: 09/23/16 Wound Type: Burn Location: Other (Comment) , R anterior 4th finger  Location Orientation: Right;Anterior Wound Description (Comments): Rt  4th fingertip  Present on Admission: Yes   Dressing Type Gauze (Comment)   Dressing Changed Changed   Dressing Status Clean;Dry   Dressing Change Frequency PRN   % Wound base Red or Granulating 100%  after eschar removal    Drainage Amount Scant   Treatment Cleansed;Debridement (Selective);Packing (Impregnated strip)  debridement, xeroform    Selective Debridement - Location slough and eschar on finger wounds    Selective Debridement - Tools Used Forceps;Scalpel;Scissors   Wound Therapy - Clinical  Statement Patient presents today doing well; upon removal of dressings, eschar on 4th finger has softened quite a bit and was able to be removed this session revealing 100% granulation tissue at this time. Dressed all wounds with xeroform today and gauze/netting. Patient doing quite well and could possbly be ready for DC at the end of the month.    Wound Therapy - Functional Problem List open hand wounds/difficulty using R hand    Factors Delaying/Impairing Wound Healing Diabetes Mellitus;Polypharmacy   Hydrotherapy Plan Debridement;Dressing change;Patient/family education   Wound Therapy - Frequency --  2x/week    Wound Therapy - Current Recommendations PT   Wound Plan continue with woundcare with focus on fourth finger.  Dressings as appropriate and conducive to healing environment.    Dressing  xeroform on all wounds                  PT Education - 10/12/16 1016    Education provided Yes   Education Details wound progress, eschar removal today, possible DC at end of month if wounds continue to rpogress this well    Person(s) Educated Patient   Methods Explanation   Comprehension Verbalized understanding          PT Short Term Goals - 09/28/16 1110      PT SHORT TERM GOAL #1   Title All wounds to present as being 100% red granulating tissue in order to show general improvement of condition    Time 3   Period Weeks   Status On-going     PT SHORT TERM GOAL #2   Title All wounds to have decreased in size by at least 50% in order to show general improvement of condition    Time 3   Period Weeks   Status On-going           PT Long Term Goals - 09/28/16 1110      PT LONG TERM GOAL #1   Title All wounds to have decreased in size by at least 75% in order to show general improvement of condition    Time 6   Period Weeks   Status On-going     PT LONG TERM GOAL #2   Title Patient/his wife to be independent in appropriately dressing/caring for appropriate wound areas  to faciltiate full wound healing on an independent basis    Time 6   Period Weeks   Status On-going     PT LONG TERM GOAL #3   Title Patient to be independent in grip strengthenign exercises to promote full R hand use after wound healing    Time 6   Period Weeks   Status On-going             Patient will benefit from skilled therapeutic intervention in order to improve the following deficits and impairments:     Visit Diagnosis: Burn of fingers, right, unspecified degree, sequela  Burn of right thumb, unspecified  burn degree, sequela  Muscle weakness (generalized)     Problem List Patient Active Problem List   Diagnosis Date Noted  . Lumbar spinal stenosis 04/15/2014    Deniece Ree PT, DPT [place] [address] [place], [place], [address] Phone: [phone]   Fax:  [phone]  Name: Mario [name] MRN: [id number] Date of Birth: August 27, 1951

## 2016-10-14 ENCOUNTER — Ambulatory Visit (HOSPITAL_COMMUNITY): Payer: BLUE CROSS/BLUE SHIELD | Admitting: Physical Therapy

## 2016-10-14 DIAGNOSIS — T23021S Burn of unspecified degree of single right finger (nail) except thumb, sequela: Secondary | ICD-10-CM

## 2016-10-14 NOTE — Therapy (Signed)
[place], [place], [address] Phone: [phone]   Fax:  [phone]  Wound Care Therapy  Patient Details  Name: Mario [name] MRN: [id number] Date of Birth: June 08, 1951 No Data Recorded  Encounter Date: 10/14/2016      PT End of Session - 10/14/16 1831    Visit Number 7   Number of Visits 12   Date for PT Re-Evaluation 10/21/16   Authorization Type AARP    Authorization Time Period 09/23/16 to 11/04/16   Authorization - Visit Number 7   Authorization - Number of Visits 10   PT Start Time [id number]   PT Stop Time 1632   PT Time Calculation (min) 28 min   Activity Tolerance Patient tolerated treatment well   Behavior During Therapy [facility] for tasks assessed/performed      Past Medical History:  Diagnosis Date  . Chronic back pain    radiculopatly  and stenosis  . Complication of anesthesia    woke up being very agitated  . Diabetes mellitus without complication    takes Metformin and Amaryl daily  . History of gout 70yrs ago  . History of kidney stones 30+yrs ago  . History of shingles 1990  . Hyperlipidemia    takes Atorvastatin daily  . Rosacea    uses Metronidazole daily    Past Surgical History:  Procedure Laterality Date  . CERVICAL FUSION  1998  . COLONOSCOPY    . LUMBAR LAMINECTOMY/DECOMPRESSION MICRODISCECTOMY N/A 04/15/2014   Procedure: LUMBAR LAMINECTOMY/DECOMPRESSION MICRODISCECTOMY 2 LEVELS  lumbar three/four, four/five  ;  Surgeon: Ophelia Charter, MD;  Location: [place] [facility];  Service: Neurosurgery;  Laterality: N/A;  . TONSILLECTOMY  1958    There were no vitals filed for this visit.                  Wound Therapy - 10/14/16 1824    Subjective Patient arrives doing well, reports no major changes recently and had a good weekend. Dressings intact.    Patient and Family Stated Goals wound resolution    Date of Onset 09/17/16   Prior Treatments wound care from MD    Pain  Assessment No/denies pain   Evaluation and Treatment Procedures Explained to Patient/Family Yes   Evaluation and Treatment Procedures agreed to   Wound Properties Date First Assessed: 09/23/16 Wound Type: Burn Location: Other (Comment) , R posterior 3rd finger   Location Orientation: Right , R posterior 3rd finger   Wound Description (Comments): R dorsal 3rd finger  Present on Admission: Yes Final Assessment Date: 10/14/16   Wound Properties Date First Assessed: 09/23/16 Wound Type: Burn Location: Other (Comment) , R posterior 4th finger   Location Orientation: Right Wound Description (Comments): R dorsal 4th finger  Present on Admission: Yes   Dressing Type Gauze (Comment)   Dressing Changed Changed   Dressing Status Clean;Dry   Dressing Change Frequency PRN   % Wound base Red or Granulating 100%   % Wound base Yellow/Fibrinous Exudate 0%   Closure Approximated   Drainage Amount None   Drainage Description --   Treatment Cleansed;Debridement (Selective)   Wound Properties Date First Assessed: 09/23/16 Wound Type: Burn Location: Other (Comment) , R anterior 4th finger   Location Orientation: Right;Anterior Wound Description (Comments): Rt  4th fingertip  Present on Admission: Yes   Dressing Type Gauze (Comment)   Dressing Changed Changed   Dressing Status Clean;Dry   Dressing Change Frequency PRN   %  Wound base Red or Granulating 100%  after eschar removal    % Wound base Yellow/Fibrinous Exudate 0%   Drainage Amount Scant   Drainage Description Serosanguineous   Treatment Cleansed;Debridement (Selective)   Selective Debridement - Location slough and eschar on finger wounds    Selective Debridement - Tools Used Forceps;Scalpel;Scissors   Wound Therapy - Clinical Statement Ring finger now healed 100% as well as dorsal aspect of ring finger.  Finger tip of ring finger (4th finger) only area remaining, however 100% granulated.  Irrigated and debrided dead skin and eschar from wounds.   Continued dressing with xeroform.  anticipate discharge within the next 2 weeks.    Wound Therapy - Functional Problem List open hand wounds/difficulty using R hand    Factors Delaying/Impairing Wound Healing Diabetes Mellitus;Polypharmacy   Hydrotherapy Plan Debridement;Dressing change;Patient/family education   Wound Therapy - Frequency --  2x/week    Wound Therapy - Current Recommendations PT   Wound Plan continue with woundcare with focus on fourth finger.  Dressings as appropriate and conducive to healing environment.    Dressing  xeroform on all wounds                    PT Short Term Goals - 09/28/16 1110      PT SHORT TERM GOAL #1   Title All wounds to present as being 100% red granulating tissue in order to show general improvement of condition    Time 3   Period Weeks   Status On-going     PT SHORT TERM GOAL #2   Title All wounds to have decreased in size by at least 50% in order to show general improvement of condition    Time 3   Period Weeks   Status On-going           PT Long Term Goals - 09/28/16 1110      PT LONG TERM GOAL #1   Title All wounds to have decreased in size by at least 75% in order to show general improvement of condition    Time 6   Period Weeks   Status On-going     PT LONG TERM GOAL #2   Title Patient/his wife to be independent in appropriately dressing/caring for appropriate wound areas to faciltiate full wound healing on an independent basis    Time 6   Period Weeks   Status On-going     PT LONG TERM GOAL #3   Title Patient to be independent in grip strengthenign exercises to promote full R hand use after wound healing    Time 6   Period Weeks   Status On-going             Patient will benefit from skilled therapeutic intervention in order to improve the following deficits and impairments:     Visit Diagnosis: Burn of fingers, right, unspecified degree, sequela     Problem List Patient Active Problem  List   Diagnosis Date Noted  . Lumbar spinal stenosis 04/15/2014    Mario [name], Mario [name] [phone]  10/14/2016, 6:33 PM  [place] [address] [place], [place], [address] Phone: [phone]   Fax:  [phone]  Name: Mario [name] MRN: [id number] Date of Birth: December 03, 1950

## 2016-10-20 ENCOUNTER — Ambulatory Visit (HOSPITAL_COMMUNITY): Payer: BLUE CROSS/BLUE SHIELD

## 2016-10-20 DIAGNOSIS — T23021S Burn of unspecified degree of single right finger (nail) except thumb, sequela: Secondary | ICD-10-CM | POA: Diagnosis not present

## 2016-10-20 DIAGNOSIS — M6281 Muscle weakness (generalized): Secondary | ICD-10-CM

## 2016-10-20 DIAGNOSIS — T23011S Burn of unspecified degree of right thumb (nail), sequela: Secondary | ICD-10-CM

## 2016-10-20 NOTE — Therapy (Signed)
[place], [place], [address] Phone: [phone]   Fax:  [phone]  Wound Care Therapy (Discharge)  Patient Details  Name: Mario [name] MRN: [id number] Date of Birth: 1951-04-11 No Data Recorded  Encounter Date: 10/20/2016      PT End of Session - 10/20/16 1641    Visit Number 8   Number of Visits 12   Date for PT Re-Evaluation 10/21/16   Authorization Type AARP    Authorization Time Period 09/23/16 to 11/04/16   Authorization - Visit Number 8   Authorization - Number of Visits 10   PT Start Time 6314   PT Stop Time 1625   PT Time Calculation (min) 18 min   Activity Tolerance Patient tolerated treatment well   Behavior During Therapy [facility] for tasks assessed/performed      Past Medical History:  Diagnosis Date  . Chronic back pain    radiculopatly  and stenosis  . Complication of anesthesia    woke up being very agitated  . Diabetes mellitus without complication    takes Metformin and Amaryl daily  . History of gout 67yr ago  . History of kidney stones 30+yrs ago  . History of shingles 1990  . Hyperlipidemia    takes Atorvastatin daily  . Rosacea    uses Metronidazole daily    Past Surgical History:  Procedure Laterality Date  . CERVICAL FUSION  1998  . COLONOSCOPY    . LUMBAR LAMINECTOMY/DECOMPRESSION MICRODISCECTOMY N/A 04/15/2014   Procedure: LUMBAR LAMINECTOMY/DECOMPRESSION MICRODISCECTOMY 2 LEVELS  lumbar three/four, four/five  ;  Surgeon: JOphelia Charter MD;  [facility];  Service: Neurosurgery;  Laterality: N/A;  . TONSILLECTOMY  1958    There were no vitals filed for this visit.       Subjective Assessment - 10/20/16 1631    Subjective Pt arrived with dressings intact, no reports of pain today   Currently in Pain? No/denies                   Wound Therapy - 10/20/16 1632    Subjective Pt arrived with dressings intact, no reports of pain today   Patient and Family Stated Goals wound resolution    Date of Onset 09/17/16   Prior Treatments wound care from MD    Pain Assessment No/denies pain   Evaluation and Treatment Procedures Explained to Patient/Family Yes   Evaluation and Treatment Procedures agreed to   Wound Properties Date First Assessed: 09/23/16 Wound Type: Burn Location: Other (Comment) , R posterior 4th finger   Location Orientation: Right Wound Description (Comments): R dorsal 4th finger  Present on Admission: Yes   Dressing Type --   Dressing Changed --   Dressing Status --   Dressing Change Frequency --   % Wound base Red or Granulating --   % Wound base Yellow/Fibrinous Exudate --   Closure --   Drainage Amount --   Treatment --   Wound Properties Date First Assessed: 09/23/16 Wound Type: Burn Location: Other (Comment) , R anterior 4th finger   Location Orientation: Right;Anterior Wound Description (Comments): Rt  4th fingertip  Present on Admission: Yes   Dressing Type Impregnated gauze (bismuth);Gauze (Comment)  xeroform, 2x2, gauze and medipore tape   Dressing Changed Changed   Dressing Status Clean;Dry;Intact   Dressing Change Frequency PRN   % Wound base Red or Granulating 100%   % Wound base Yellow/Fibrinous Exudate 0%   Drainage Amount  Scant   Drainage Description Serosanguineous   Treatment Cleansed;Debridement (Selective)   Selective Debridement - Location finger ti;   Selective Debridement - Tools Used Scalpel   Selective Debridement - Tissue Removed dry skin perimeter   Wound Therapy - Clinical Statement All wounds fulled healed except for ring fingertip.  Wound 100% fully granulated wiht only dry skin perimeter of wound.  Pt educated on wound care at home and able to verbalize appropriate technique,  No further skilled intervention required.   Wound Therapy - Functional Problem List open hand wounds/difficulty using R hand    Factors Delaying/Impairing Wound Healing Diabetes Mellitus;Polypharmacy    Hydrotherapy Plan Debridement;Dressing change;Patient/family education   Wound Therapy - Frequency --  DC today   Wound Therapy - Current Recommendations PT   Wound Plan DC to self care as no further skilled intervention required.    Dressing  xeroform on all wounds                    PT Short Term Goals - 09/28/16 1110      PT SHORT TERM GOAL #1   Title All wounds to present as being 100% red granulating tissue in order to show general improvement of condition    Time 3   Period Weeks   Status Achieved     PT SHORT TERM GOAL #2   Title All wounds to have decreased in size by at least 50% in order to show general improvement of condition    Time 3   Period Weeks   Status Partially met            PT Long Term Goals - 09/28/16 1110      PT LONG TERM GOAL #1   Title All wounds to have decreased in size by at least 75% in order to show general improvement of condition    Time 6   Period Weeks   Status Partially met      PT LONG TERM GOAL #2   Title Patient/his wife to be independent in appropriately dressing/caring for appropriate wound areas to faciltiate full wound healing on an independent basis    Time 6   Period Weeks   Status Achieved     PT LONG TERM GOAL #3   Title Patient to be independent in grip strengthenign exercises to promote full R hand use after wound healing    Time 6   Period Weeks   Status Not met              Patient will benefit from skilled therapeutic intervention in order to improve the following deficits and impairments:     Visit Diagnosis: Burn of fingers, right, unspecified degree, sequela  Burn of right thumb, unspecified burn degree, sequela  Muscle weakness (generalized)   Problem List Patient Active Problem List   Diagnosis Date Noted  . Lumbar spinal stenosis 04/15/2014   Ihor Austin, LPTA; CBIS [phone]       G-Codes - Oct 27, 2016 1753    Functional Assessment Tool Used Based on skilled  clincial assessment of wound healing status, hand use, pain patterns    Functional Limitation Other PT primary   Other PT Primary Goal Status [phone]) At least 40 percent but less than 60 percent impaired, limited or restricted   Other PT Primary Discharge Status [phone]) At least 1 percent but less than 20 percent impaired, limited or restricted     PHYSICAL THERAPY DISCHARGE SUMMARY  Visits from Start of  Care: 8  Current functional level related to goals / functional outcomes: All wounds have resolved except for open area on ring finger, however this wound has progressed quite well and patient is comfortable with DC to his wife, who has clinical experience as a nurse, performing xeroform dressing changes regularly. DC to self care, all needs otherwise met during this course of skilled wound care.    Remaining deficits: Open wound ring finger of affected hand    Education / Equipment: Self-care/dressing moving forward, DC to independent wound care program with wife  Plan: Patient agrees to discharge.  Patient goals were partially met. Patient is being discharged due to being pleased with the current functional level.  ?????       Deniece Ree PT, DPT [facility] [address] [place], [place], [address] Phone: [phone]   Fax:  [phone]  Name: Mario [name] MRN: [id number] Date of Birth: 10/30/1950

## 2016-10-26 ENCOUNTER — Ambulatory Visit (HOSPITAL_COMMUNITY): Payer: BLUE CROSS/BLUE SHIELD | Admitting: Physical Therapy

## 2016-10-28 ENCOUNTER — Ambulatory Visit (HOSPITAL_COMMUNITY): Payer: BLUE CROSS/BLUE SHIELD | Admitting: Physical Therapy

## 2016-11-02 ENCOUNTER — Ambulatory Visit (HOSPITAL_COMMUNITY): Payer: BLUE CROSS/BLUE SHIELD | Admitting: Physical Therapy

## 2016-11-04 ENCOUNTER — Ambulatory Visit (HOSPITAL_COMMUNITY): Payer: BLUE CROSS/BLUE SHIELD | Admitting: Physical Therapy

## 2017-04-13 ENCOUNTER — Telehealth (HOSPITAL_COMMUNITY): Payer: Self-pay | Admitting: Physical Therapy

## 2017-04-13 NOTE — Telephone Encounter (Signed)
Faxed patient's progress notes to referring MD. [facility] attn: Francesco Runner, [place]

## 2017-09-23 ENCOUNTER — Encounter: Payer: Self-pay | Admitting: *Deleted

## 2017-09-23 ENCOUNTER — Other Ambulatory Visit: Payer: Self-pay | Admitting: *Deleted

## 2017-09-23 NOTE — Patient Outreach (Addendum)
[facility]) Care Management  09/23/2017  Mario [name] [birth year] [phone]   Health Risk Assessment call  Placed call to patient, explained purpose of the call, patient states he is unable to talk at this time requested return call on next week.  Will plan return call in the next week.   1400 Addendum Patient returned call , again explained the purpose of the call. Patient discussed his medical conditions  Diabetes; Recent Alc < 8 ,checking blood sugar daily average readings in the 90 range, taking Jardiance daily follows up regularly with her PCP Dr.Swain. Patient able to state low blood sugar symptoms and treatment, reports rare episode of hypoglycemia. Patient reports he has had nutrition/diabetes education in th past and knows how  to manage condition, declines need for further education support.  He reports he attended [facility] program for weight loss and has kept off 100 lbs for 14 years. Patient states he is managing  Diabetes well and hoping to maintain A1c less than 7.  Hypertension Wife is a Marine scientist and helps with monitoring his blood pressure at least weekly, and he takes medications as prescribed.  On greater than 5 medication  Patient reports no cost concerns with medication , recently filled prescription for Jardiance 90 day supply for $30.   Patient denies any concerns or follow needs at this time, will send follow up letter with [facility] care management contact information .     Joylene Draft, RN, [place] Management Coordinator  [phone]- Mobile [phone]- Toll Free Main Office

## 2017-12-22 DIAGNOSIS — M5416 Radiculopathy, lumbar region: Secondary | ICD-10-CM | POA: Diagnosis not present

## 2017-12-22 DIAGNOSIS — E1149 Type 2 diabetes mellitus with other diabetic neurological complication: Secondary | ICD-10-CM | POA: Diagnosis not present

## 2017-12-22 DIAGNOSIS — I10 Essential (primary) hypertension: Secondary | ICD-10-CM | POA: Diagnosis not present

## 2017-12-22 DIAGNOSIS — L719 Rosacea, unspecified: Secondary | ICD-10-CM | POA: Diagnosis not present

## 2017-12-22 DIAGNOSIS — L219 Seborrheic dermatitis, unspecified: Secondary | ICD-10-CM | POA: Diagnosis not present

## 2017-12-22 DIAGNOSIS — Z7984 Long term (current) use of oral hypoglycemic drugs: Secondary | ICD-10-CM | POA: Diagnosis not present

## 2017-12-22 DIAGNOSIS — E7800 Pure hypercholesterolemia, unspecified: Secondary | ICD-10-CM | POA: Diagnosis not present

## 2017-12-22 DIAGNOSIS — I493 Ventricular premature depolarization: Secondary | ICD-10-CM | POA: Diagnosis not present

## 2018-06-23 DIAGNOSIS — M5416 Radiculopathy, lumbar region: Secondary | ICD-10-CM | POA: Diagnosis not present

## 2018-06-23 DIAGNOSIS — Z23 Encounter for immunization: Secondary | ICD-10-CM | POA: Diagnosis not present

## 2018-06-23 DIAGNOSIS — I10 Essential (primary) hypertension: Secondary | ICD-10-CM | POA: Diagnosis not present

## 2018-06-23 DIAGNOSIS — E1149 Type 2 diabetes mellitus with other diabetic neurological complication: Secondary | ICD-10-CM | POA: Diagnosis not present

## 2018-06-23 DIAGNOSIS — I493 Ventricular premature depolarization: Secondary | ICD-10-CM | POA: Diagnosis not present

## 2018-06-23 DIAGNOSIS — Z1211 Encounter for screening for malignant neoplasm of colon: Secondary | ICD-10-CM | POA: Diagnosis not present

## 2018-06-23 DIAGNOSIS — Z0000 Encounter for general adult medical examination without abnormal findings: Secondary | ICD-10-CM | POA: Diagnosis not present

## 2018-06-23 DIAGNOSIS — Z125 Encounter for screening for malignant neoplasm of prostate: Secondary | ICD-10-CM | POA: Diagnosis not present

## 2018-06-23 DIAGNOSIS — E7800 Pure hypercholesterolemia, unspecified: Secondary | ICD-10-CM | POA: Diagnosis not present

## 2018-06-23 DIAGNOSIS — Z1389 Encounter for screening for other disorder: Secondary | ICD-10-CM | POA: Diagnosis not present

## 2018-06-23 DIAGNOSIS — L719 Rosacea, unspecified: Secondary | ICD-10-CM | POA: Diagnosis not present

## 2018-07-12 DIAGNOSIS — D1801 Hemangioma of skin and subcutaneous tissue: Secondary | ICD-10-CM | POA: Diagnosis not present

## 2018-07-12 DIAGNOSIS — L814 Other melanin hyperpigmentation: Secondary | ICD-10-CM | POA: Diagnosis not present

## 2018-07-12 DIAGNOSIS — L218 Other seborrheic dermatitis: Secondary | ICD-10-CM | POA: Diagnosis not present

## 2018-07-12 DIAGNOSIS — D225 Melanocytic nevi of trunk: Secondary | ICD-10-CM | POA: Diagnosis not present

## 2018-07-12 DIAGNOSIS — L570 Actinic keratosis: Secondary | ICD-10-CM | POA: Diagnosis not present

## 2018-07-12 DIAGNOSIS — D2271 Melanocytic nevi of right lower limb, including hip: Secondary | ICD-10-CM | POA: Diagnosis not present

## 2018-07-12 DIAGNOSIS — L821 Other seborrheic keratosis: Secondary | ICD-10-CM | POA: Diagnosis not present

## 2018-11-06 DIAGNOSIS — L719 Rosacea, unspecified: Secondary | ICD-10-CM | POA: Diagnosis not present

## 2018-11-06 DIAGNOSIS — E7800 Pure hypercholesterolemia, unspecified: Secondary | ICD-10-CM | POA: Diagnosis not present

## 2018-11-06 DIAGNOSIS — E119 Type 2 diabetes mellitus without complications: Secondary | ICD-10-CM | POA: Diagnosis not present

## 2018-11-06 DIAGNOSIS — E1149 Type 2 diabetes mellitus with other diabetic neurological complication: Secondary | ICD-10-CM | POA: Diagnosis not present

## 2018-11-06 DIAGNOSIS — I10 Essential (primary) hypertension: Secondary | ICD-10-CM | POA: Diagnosis not present

## 2018-11-21 DIAGNOSIS — E139 Other specified diabetes mellitus without complications: Secondary | ICD-10-CM | POA: Diagnosis not present

## 2018-11-21 DIAGNOSIS — M2041 Other hammer toe(s) (acquired), right foot: Secondary | ICD-10-CM | POA: Diagnosis not present

## 2018-11-21 DIAGNOSIS — M21611 Bunion of right foot: Secondary | ICD-10-CM | POA: Diagnosis not present

## 2018-11-21 DIAGNOSIS — M205X1 Other deformities of toe(s) (acquired), right foot: Secondary | ICD-10-CM | POA: Diagnosis not present

## 2018-11-21 DIAGNOSIS — L02611 Cutaneous abscess of right foot: Secondary | ICD-10-CM | POA: Diagnosis not present

## 2018-12-07 DIAGNOSIS — B353 Tinea pedis: Secondary | ICD-10-CM | POA: Diagnosis not present

## 2018-12-07 DIAGNOSIS — E139 Other specified diabetes mellitus without complications: Secondary | ICD-10-CM | POA: Diagnosis not present

## 2018-12-07 DIAGNOSIS — L03031 Cellulitis of right toe: Secondary | ICD-10-CM | POA: Diagnosis not present

## 2019-01-04 DIAGNOSIS — L03031 Cellulitis of right toe: Secondary | ICD-10-CM | POA: Diagnosis not present

## 2019-01-04 DIAGNOSIS — B353 Tinea pedis: Secondary | ICD-10-CM | POA: Diagnosis not present

## 2019-02-21 DIAGNOSIS — M205X1 Other deformities of toe(s) (acquired), right foot: Secondary | ICD-10-CM | POA: Diagnosis not present

## 2019-03-20 DIAGNOSIS — M205X1 Other deformities of toe(s) (acquired), right foot: Secondary | ICD-10-CM | POA: Diagnosis not present

## 2019-06-27 DIAGNOSIS — E7800 Pure hypercholesterolemia, unspecified: Secondary | ICD-10-CM | POA: Diagnosis not present

## 2019-06-27 DIAGNOSIS — E785 Hyperlipidemia, unspecified: Secondary | ICD-10-CM | POA: Diagnosis not present

## 2019-06-27 DIAGNOSIS — E1149 Type 2 diabetes mellitus with other diabetic neurological complication: Secondary | ICD-10-CM | POA: Diagnosis not present

## 2019-06-27 DIAGNOSIS — E119 Type 2 diabetes mellitus without complications: Secondary | ICD-10-CM | POA: Diagnosis not present

## 2019-06-27 DIAGNOSIS — I10 Essential (primary) hypertension: Secondary | ICD-10-CM | POA: Diagnosis not present

## 2019-07-10 DIAGNOSIS — Z1389 Encounter for screening for other disorder: Secondary | ICD-10-CM | POA: Diagnosis not present

## 2019-07-10 DIAGNOSIS — Z1211 Encounter for screening for malignant neoplasm of colon: Secondary | ICD-10-CM | POA: Diagnosis not present

## 2019-07-10 DIAGNOSIS — E1149 Type 2 diabetes mellitus with other diabetic neurological complication: Secondary | ICD-10-CM | POA: Diagnosis not present

## 2019-07-10 DIAGNOSIS — I10 Essential (primary) hypertension: Secondary | ICD-10-CM | POA: Diagnosis not present

## 2019-07-10 DIAGNOSIS — Z0000 Encounter for general adult medical examination without abnormal findings: Secondary | ICD-10-CM | POA: Diagnosis not present

## 2019-07-10 DIAGNOSIS — E7800 Pure hypercholesterolemia, unspecified: Secondary | ICD-10-CM | POA: Diagnosis not present

## 2019-07-10 DIAGNOSIS — L719 Rosacea, unspecified: Secondary | ICD-10-CM | POA: Diagnosis not present

## 2019-07-10 DIAGNOSIS — Z125 Encounter for screening for malignant neoplasm of prostate: Secondary | ICD-10-CM | POA: Diagnosis not present

## 2019-07-13 DIAGNOSIS — D485 Neoplasm of uncertain behavior of skin: Secondary | ICD-10-CM | POA: Diagnosis not present

## 2019-07-13 DIAGNOSIS — D0439 Carcinoma in situ of skin of other parts of face: Secondary | ICD-10-CM | POA: Diagnosis not present

## 2019-07-13 DIAGNOSIS — L821 Other seborrheic keratosis: Secondary | ICD-10-CM | POA: Diagnosis not present

## 2019-07-13 DIAGNOSIS — D1801 Hemangioma of skin and subcutaneous tissue: Secondary | ICD-10-CM | POA: Diagnosis not present

## 2019-07-13 DIAGNOSIS — D2271 Melanocytic nevi of right lower limb, including hip: Secondary | ICD-10-CM | POA: Diagnosis not present

## 2019-07-13 DIAGNOSIS — L573 Poikiloderma of Civatte: Secondary | ICD-10-CM | POA: Diagnosis not present

## 2019-07-13 DIAGNOSIS — L218 Other seborrheic dermatitis: Secondary | ICD-10-CM | POA: Diagnosis not present

## 2019-07-13 DIAGNOSIS — L570 Actinic keratosis: Secondary | ICD-10-CM | POA: Diagnosis not present

## 2019-07-25 DIAGNOSIS — D0439 Carcinoma in situ of skin of other parts of face: Secondary | ICD-10-CM | POA: Diagnosis not present

## 2019-09-11 DIAGNOSIS — E119 Type 2 diabetes mellitus without complications: Secondary | ICD-10-CM | POA: Diagnosis not present

## 2019-09-11 DIAGNOSIS — E1149 Type 2 diabetes mellitus with other diabetic neurological complication: Secondary | ICD-10-CM | POA: Diagnosis not present

## 2019-09-11 DIAGNOSIS — E785 Hyperlipidemia, unspecified: Secondary | ICD-10-CM | POA: Diagnosis not present

## 2019-09-11 DIAGNOSIS — E7800 Pure hypercholesterolemia, unspecified: Secondary | ICD-10-CM | POA: Diagnosis not present

## 2019-09-11 DIAGNOSIS — I10 Essential (primary) hypertension: Secondary | ICD-10-CM | POA: Diagnosis not present

## 2019-11-20 DIAGNOSIS — M2041 Other hammer toe(s) (acquired), right foot: Secondary | ICD-10-CM | POA: Diagnosis not present

## 2019-11-20 DIAGNOSIS — E139 Other specified diabetes mellitus without complications: Secondary | ICD-10-CM | POA: Diagnosis not present

## 2019-11-20 DIAGNOSIS — B353 Tinea pedis: Secondary | ICD-10-CM | POA: Diagnosis not present

## 2019-12-04 DIAGNOSIS — E785 Hyperlipidemia, unspecified: Secondary | ICD-10-CM | POA: Diagnosis not present

## 2019-12-04 DIAGNOSIS — E119 Type 2 diabetes mellitus without complications: Secondary | ICD-10-CM | POA: Diagnosis not present

## 2019-12-04 DIAGNOSIS — Z7984 Long term (current) use of oral hypoglycemic drugs: Secondary | ICD-10-CM | POA: Diagnosis not present

## 2019-12-04 DIAGNOSIS — E1149 Type 2 diabetes mellitus with other diabetic neurological complication: Secondary | ICD-10-CM | POA: Diagnosis not present

## 2019-12-04 DIAGNOSIS — I10 Essential (primary) hypertension: Secondary | ICD-10-CM | POA: Diagnosis not present

## 2019-12-04 DIAGNOSIS — E7800 Pure hypercholesterolemia, unspecified: Secondary | ICD-10-CM | POA: Diagnosis not present

## 2019-12-20 DIAGNOSIS — D0439 Carcinoma in situ of skin of other parts of face: Secondary | ICD-10-CM | POA: Diagnosis not present

## 2019-12-20 DIAGNOSIS — Z85828 Personal history of other malignant neoplasm of skin: Secondary | ICD-10-CM | POA: Diagnosis not present

## 2019-12-20 DIAGNOSIS — D485 Neoplasm of uncertain behavior of skin: Secondary | ICD-10-CM | POA: Diagnosis not present

## 2019-12-28 DIAGNOSIS — B353 Tinea pedis: Secondary | ICD-10-CM | POA: Diagnosis not present

## 2019-12-28 DIAGNOSIS — E139 Other specified diabetes mellitus without complications: Secondary | ICD-10-CM | POA: Diagnosis not present

## 2020-01-31 DIAGNOSIS — C44329 Squamous cell carcinoma of skin of other parts of face: Secondary | ICD-10-CM | POA: Diagnosis not present

## 2020-02-19 DIAGNOSIS — L719 Rosacea, unspecified: Secondary | ICD-10-CM | POA: Diagnosis not present

## 2020-02-19 DIAGNOSIS — F439 Reaction to severe stress, unspecified: Secondary | ICD-10-CM | POA: Diagnosis not present

## 2020-02-19 DIAGNOSIS — I10 Essential (primary) hypertension: Secondary | ICD-10-CM | POA: Diagnosis not present

## 2020-02-19 DIAGNOSIS — E7800 Pure hypercholesterolemia, unspecified: Secondary | ICD-10-CM | POA: Diagnosis not present

## 2020-02-19 DIAGNOSIS — E1149 Type 2 diabetes mellitus with other diabetic neurological complication: Secondary | ICD-10-CM | POA: Diagnosis not present

## 2020-02-19 DIAGNOSIS — Z794 Long term (current) use of insulin: Secondary | ICD-10-CM | POA: Diagnosis not present

## 2020-03-04 DIAGNOSIS — E785 Hyperlipidemia, unspecified: Secondary | ICD-10-CM | POA: Diagnosis not present

## 2020-03-04 DIAGNOSIS — E7800 Pure hypercholesterolemia, unspecified: Secondary | ICD-10-CM | POA: Diagnosis not present

## 2020-03-04 DIAGNOSIS — I10 Essential (primary) hypertension: Secondary | ICD-10-CM | POA: Diagnosis not present

## 2020-03-04 DIAGNOSIS — E119 Type 2 diabetes mellitus without complications: Secondary | ICD-10-CM | POA: Diagnosis not present

## 2020-03-04 DIAGNOSIS — E1149 Type 2 diabetes mellitus with other diabetic neurological complication: Secondary | ICD-10-CM | POA: Diagnosis not present

## 2020-04-21 DIAGNOSIS — E785 Hyperlipidemia, unspecified: Secondary | ICD-10-CM | POA: Diagnosis not present

## 2020-04-21 DIAGNOSIS — I10 Essential (primary) hypertension: Secondary | ICD-10-CM | POA: Diagnosis not present

## 2020-04-21 DIAGNOSIS — E1149 Type 2 diabetes mellitus with other diabetic neurological complication: Secondary | ICD-10-CM | POA: Diagnosis not present

## 2020-04-21 DIAGNOSIS — E7800 Pure hypercholesterolemia, unspecified: Secondary | ICD-10-CM | POA: Diagnosis not present

## 2020-04-21 DIAGNOSIS — E119 Type 2 diabetes mellitus without complications: Secondary | ICD-10-CM | POA: Diagnosis not present

## 2020-05-22 DIAGNOSIS — E785 Hyperlipidemia, unspecified: Secondary | ICD-10-CM | POA: Diagnosis not present

## 2020-05-22 DIAGNOSIS — E119 Type 2 diabetes mellitus without complications: Secondary | ICD-10-CM | POA: Diagnosis not present

## 2020-05-22 DIAGNOSIS — I10 Essential (primary) hypertension: Secondary | ICD-10-CM | POA: Diagnosis not present

## 2020-05-22 DIAGNOSIS — E7800 Pure hypercholesterolemia, unspecified: Secondary | ICD-10-CM | POA: Diagnosis not present

## 2020-05-22 DIAGNOSIS — E1149 Type 2 diabetes mellitus with other diabetic neurological complication: Secondary | ICD-10-CM | POA: Diagnosis not present

## 2020-05-29 ENCOUNTER — Ambulatory Visit
Admission: EM | Admit: 2020-05-29 | Discharge: 2020-05-29 | Disposition: A | Discharge status: Home / Self Care | Payer: PPO

## 2020-05-29 ENCOUNTER — Other Ambulatory Visit: Payer: Self-pay

## 2020-05-29 DIAGNOSIS — M5412 Radiculopathy, cervical region: Secondary | ICD-10-CM

## 2020-05-29 MED ORDER — GABAPENTIN 100 MG PO CAPS: 100.0000 mg | ORAL_CAPSULE | Freq: Three times a day (TID) | ORAL | 0 refills | Status: DC

## 2020-05-29 NOTE — Discharge Instructions (Signed)
Take gabapentin as prescribed Follow RICE instruction that is attached Follow-up with PCP for reevaluation Return or go to ED for worsening of symptoms

## 2020-05-29 NOTE — ED Provider Notes (Signed)
[facility]   [id number] 05/29/20 Arrival Time: 0626   Chief Complaint  Patient presents with  . Hand Pain     SUBJECTIVE: History from: patient.  GOLDEN GILREATH is a 69 y.o. male who presents to the urgent care with a complaint of radiating left arm and hand pain for the past 1 month.   Denies any precipitating event.  Reported he has cervical spine surgery that was completed 1998 report symptom is similar.  He describes the pain as constant and achy.  He has tried OTC medications without relief.  His symptoms are made worse with ROM.  He denies similar symptoms in the past.  Denies trauma or injury.  Denies chills, fever, nausea, vomiting, diarrhea, tingling, numbness, confusion, blurry vision, diplopia, facial droop.   ROS: As per HPI.  All other pertinent ROS negative.     Past Medical History:  Diagnosis Date  . Chronic back pain    radiculopatly  and stenosis  . Complication of anesthesia    woke up being very agitated  . Diabetes mellitus without complication ([place])    takes Metformin and Amaryl daily  . History of gout 41yrs ago  . History of kidney stones 30+yrs ago  . History of shingles 1990  . Hyperlipidemia    takes Atorvastatin daily  . Rosacea    uses Metronidazole daily   Past Surgical History:  Procedure Laterality Date  . CERVICAL FUSION  1998  . COLONOSCOPY    . LUMBAR LAMINECTOMY/DECOMPRESSION MICRODISCECTOMY N/A 04/15/2014   Procedure: LUMBAR LAMINECTOMY/DECOMPRESSION MICRODISCECTOMY 2 LEVELS  lumbar three/four, four/five  ;  Surgeon: Ophelia Charter, MD;  Location: [facility];  Service: Neurosurgery;  Laterality: N/A;  . TONSILLECTOMY  1958   Allergies  Allergen Reactions  . Adhesive [Tape]     Breaks out   No current facility-administered medications on file prior to encounter.   Current Outpatient Medications on File Prior to Encounter  Medication Sig Dispense Refill  . olmesartan (BENICAR) 20 MG tablet Take 20 mg by mouth daily.     Marland Kitchen aspirin EC 325 MG tablet Take 325 mg by mouth daily.    Marland Kitchen atorvastatin (LIPITOR) 40 MG tablet Take 40 mg by mouth at bedtime.    . benazepril (LOTENSIN) 5 MG tablet Take 5 mg by mouth daily.    . diazepam (VALIUM) 5 MG tablet Take 1 tablet (5 mg total) by mouth every 6 (six) hours as needed for muscle spasms. 50 tablet 1  . docusate sodium 100 MG CAPS Take 100 mg by mouth 2 (two) times daily. 60 capsule 0  . glimepiride (AMARYL) 2 MG tablet Take 2 mg by mouth 2 (two) times daily.    . Ibuprofen-Diphenhydramine Cit (ADVIL PM PO) Take 2 tablets by mouth at bedtime.    . metFORMIN (GLUCOPHAGE) 1000 MG tablet Take 1,000 mg by mouth 2 (two) times daily with a meal.    . metroNIDAZOLE (METROCREAM) 0.75 % cream Apply 1 application topically 2 (two) times daily. To face    . Multiple Vitamins-Minerals (OCUVITE PO) Take 1 tablet by mouth daily.    . Omega-3 Fatty Acids (FISH OIL) 1200 MG CAPS Take 2,400 mg by mouth daily.    Marland Kitchen oxyCODONE-acetaminophen (PERCOCET) 10-325 MG per tablet Take 1 tablet by mouth every 4 (four) hours as needed for pain. 100 tablet 0   Social History   Socioeconomic History  . Marital status: Married    Spouse name: Not on file  .  Number of children: Not on file  . Years of education: Not on file  . Highest education level: Not on file  Occupational History  . Not on file  Tobacco Use  . Smoking status: Never Smoker  Substance and Sexual Activity  . Alcohol use: No  . Drug use: No  . Sexual activity: Yes  Other Topics Concern  . Not on file  Social History Narrative  . Not on file   Social Determinants of Health   Financial Resource Strain:   . Difficulty of Paying Living Expenses:   Food Insecurity:   . Worried About Charity fundraiser in the Last Year:   . Arboriculturist in the Last Year:   Transportation Needs:   . Film/video editor (Medical):   Marland Kitchen Lack of Transportation (Non-Medical):   Physical Activity:   . Days of Exercise per Week:   .  Minutes of Exercise per Session:   Stress:   . Feeling of Stress :   Social Connections:   . Frequency of Communication with Friends and Family:   . Frequency of Social Gatherings with Friends and Family:   . Attends Religious Services:   . Active Member of Clubs or Organizations:   . Attends Archivist Meetings:   Marland Kitchen Marital Status:   Intimate Partner Violence:   . Fear of Current or Ex-Partner:   . Emotionally Abused:   Marland Kitchen Physically Abused:   . Sexually Abused:    Family History  Family history unknown: Yes    OBJECTIVE:  Vitals:   05/29/20 0927  BP: (!) 144/76  Pulse: 69  Resp: 16  Temp: 97.8 F (36.6 C)  TempSrc: Oral  SpO2: 97%     Physical Exam Vitals and nursing note reviewed.  Constitutional:      General: He is not in acute distress.    Appearance: Normal appearance. He is normal weight. He is not ill-appearing, toxic-appearing or diaphoretic.  Cardiovascular:     Rate and Rhythm: Normal rate and regular rhythm.     Pulses: Normal pulses.     Heart sounds: Normal heart sounds. No murmur heard.  No friction rub. No gallop.   Pulmonary:     Effort: Pulmonary effort is normal. No respiratory distress.     Breath sounds: Normal breath sounds. No stridor. No wheezing, rhonchi or rales.  Chest:     Chest wall: No tenderness.  Musculoskeletal:        General: Tenderness present.     Right upper arm: No swelling, edema, deformity, lacerations, tenderness or bony tenderness.     Left upper arm: Tenderness present. No swelling, edema, deformity, lacerations or bony tenderness.     Right hand: Normal. No swelling, deformity, lacerations, tenderness or bony tenderness. Normal range of motion. Normal strength. Normal sensation. There is no disruption of two-point discrimination. Normal capillary refill. Normal pulse.     Left hand: Tenderness present. No deformity, lacerations or bony tenderness. Normal range of motion. Normal strength. Normal sensation.  There is no disruption of two-point discrimination. Normal capillary refill. Normal pulse.     Comments: Left hand is slightly swelling as compared to the right hand  Neurological:     Mental Status: He is alert.      LABS:  No results found for this or any previous visit (from the past 24 hour(s)).   ASSESSMENT & PLAN:  1. Cervical radiculopathy     Meds ordered this encounter  Medications  .  gabapentin (NEURONTIN) 100 MG capsule    Sig: Take 1 capsule (100 mg total) by mouth 3 (three) times daily.    Dispense:  90 capsule    Refill:  0    Discharge Instructions.  Take gabapentin as prescribed Follow RICE instruction that is attached Follow-up with PCP for reevaluation Return or go to ED for worsening of symptoms  Reviewed expectations re: course of current medical issues. Questions answered. Outlined signs and symptoms indicating need for more acute intervention. Patient verbalized understanding. After Visit Summary given.      Note: This document was prepared using Dragon voice recognition software and may include unintentional dictation errors.    Emerson Monte, [place] 05/29/20 [phone]

## 2020-06-03 DIAGNOSIS — M79602 Pain in left arm: Secondary | ICD-10-CM | POA: Diagnosis not present

## 2020-06-03 DIAGNOSIS — M79601 Pain in right arm: Secondary | ICD-10-CM | POA: Diagnosis not present

## 2020-06-11 ENCOUNTER — Other Ambulatory Visit: Payer: Self-pay | Admitting: Neurosurgery

## 2020-06-11 DIAGNOSIS — M79602 Pain in left arm: Secondary | ICD-10-CM

## 2020-06-11 DIAGNOSIS — M79601 Pain in right arm: Secondary | ICD-10-CM

## 2020-07-10 DIAGNOSIS — M19071 Primary osteoarthritis, right ankle and foot: Secondary | ICD-10-CM | POA: Diagnosis not present

## 2020-07-10 DIAGNOSIS — B353 Tinea pedis: Secondary | ICD-10-CM | POA: Diagnosis not present

## 2020-07-10 DIAGNOSIS — R2689 Other abnormalities of gait and mobility: Secondary | ICD-10-CM | POA: Diagnosis not present

## 2020-07-10 DIAGNOSIS — E139 Other specified diabetes mellitus without complications: Secondary | ICD-10-CM | POA: Diagnosis not present

## 2020-07-16 DIAGNOSIS — D2271 Melanocytic nevi of right lower limb, including hip: Secondary | ICD-10-CM | POA: Diagnosis not present

## 2020-07-16 DIAGNOSIS — L570 Actinic keratosis: Secondary | ICD-10-CM | POA: Diagnosis not present

## 2020-07-16 DIAGNOSIS — D485 Neoplasm of uncertain behavior of skin: Secondary | ICD-10-CM | POA: Diagnosis not present

## 2020-07-16 DIAGNOSIS — D1801 Hemangioma of skin and subcutaneous tissue: Secondary | ICD-10-CM | POA: Diagnosis not present

## 2020-07-16 DIAGNOSIS — C44329 Squamous cell carcinoma of skin of other parts of face: Secondary | ICD-10-CM | POA: Diagnosis not present

## 2020-07-16 DIAGNOSIS — L814 Other melanin hyperpigmentation: Secondary | ICD-10-CM | POA: Diagnosis not present

## 2020-07-16 DIAGNOSIS — Z85828 Personal history of other malignant neoplasm of skin: Secondary | ICD-10-CM | POA: Diagnosis not present

## 2020-07-16 DIAGNOSIS — D225 Melanocytic nevi of trunk: Secondary | ICD-10-CM | POA: Diagnosis not present

## 2020-07-16 DIAGNOSIS — L821 Other seborrheic keratosis: Secondary | ICD-10-CM | POA: Diagnosis not present

## 2020-07-16 DIAGNOSIS — L218 Other seborrheic dermatitis: Secondary | ICD-10-CM | POA: Diagnosis not present

## 2020-07-23 DIAGNOSIS — E785 Hyperlipidemia, unspecified: Secondary | ICD-10-CM | POA: Diagnosis not present

## 2020-07-23 DIAGNOSIS — I10 Essential (primary) hypertension: Secondary | ICD-10-CM | POA: Diagnosis not present

## 2020-07-23 DIAGNOSIS — E1149 Type 2 diabetes mellitus with other diabetic neurological complication: Secondary | ICD-10-CM | POA: Diagnosis not present

## 2020-07-23 DIAGNOSIS — E7800 Pure hypercholesterolemia, unspecified: Secondary | ICD-10-CM | POA: Diagnosis not present

## 2020-07-23 DIAGNOSIS — E1169 Type 2 diabetes mellitus with other specified complication: Secondary | ICD-10-CM | POA: Diagnosis not present

## 2020-07-23 DIAGNOSIS — E119 Type 2 diabetes mellitus without complications: Secondary | ICD-10-CM | POA: Diagnosis not present

## 2020-08-01 DIAGNOSIS — Z794 Long term (current) use of insulin: Secondary | ICD-10-CM | POA: Diagnosis not present

## 2020-08-01 DIAGNOSIS — I10 Essential (primary) hypertension: Secondary | ICD-10-CM | POA: Diagnosis not present

## 2020-08-01 DIAGNOSIS — F439 Reaction to severe stress, unspecified: Secondary | ICD-10-CM | POA: Diagnosis not present

## 2020-08-01 DIAGNOSIS — E1169 Type 2 diabetes mellitus with other specified complication: Secondary | ICD-10-CM | POA: Diagnosis not present

## 2020-08-01 DIAGNOSIS — Z85828 Personal history of other malignant neoplasm of skin: Secondary | ICD-10-CM | POA: Diagnosis not present

## 2020-08-01 DIAGNOSIS — Z1389 Encounter for screening for other disorder: Secondary | ICD-10-CM | POA: Diagnosis not present

## 2020-08-01 DIAGNOSIS — Z125 Encounter for screening for malignant neoplasm of prostate: Secondary | ICD-10-CM | POA: Diagnosis not present

## 2020-08-01 DIAGNOSIS — Z23 Encounter for immunization: Secondary | ICD-10-CM | POA: Diagnosis not present

## 2020-08-01 DIAGNOSIS — L719 Rosacea, unspecified: Secondary | ICD-10-CM | POA: Diagnosis not present

## 2020-08-01 DIAGNOSIS — Z0000 Encounter for general adult medical examination without abnormal findings: Secondary | ICD-10-CM | POA: Diagnosis not present

## 2020-08-01 DIAGNOSIS — E7800 Pure hypercholesterolemia, unspecified: Secondary | ICD-10-CM | POA: Diagnosis not present

## 2020-08-01 DIAGNOSIS — Z1211 Encounter for screening for malignant neoplasm of colon: Secondary | ICD-10-CM | POA: Diagnosis not present

## 2020-08-07 DIAGNOSIS — E1149 Type 2 diabetes mellitus with other diabetic neurological complication: Secondary | ICD-10-CM | POA: Diagnosis not present

## 2020-08-07 DIAGNOSIS — E7800 Pure hypercholesterolemia, unspecified: Secondary | ICD-10-CM | POA: Diagnosis not present

## 2020-08-07 DIAGNOSIS — I10 Essential (primary) hypertension: Secondary | ICD-10-CM | POA: Diagnosis not present

## 2020-08-07 DIAGNOSIS — E119 Type 2 diabetes mellitus without complications: Secondary | ICD-10-CM | POA: Diagnosis not present

## 2020-08-07 DIAGNOSIS — E1169 Type 2 diabetes mellitus with other specified complication: Secondary | ICD-10-CM | POA: Diagnosis not present

## 2020-08-07 DIAGNOSIS — E785 Hyperlipidemia, unspecified: Secondary | ICD-10-CM | POA: Diagnosis not present

## 2020-08-18 DIAGNOSIS — C44329 Squamous cell carcinoma of skin of other parts of face: Secondary | ICD-10-CM | POA: Diagnosis not present

## 2020-08-18 DIAGNOSIS — Z85828 Personal history of other malignant neoplasm of skin: Secondary | ICD-10-CM | POA: Diagnosis not present

## 2020-08-24 DIAGNOSIS — I10 Essential (primary) hypertension: Secondary | ICD-10-CM | POA: Diagnosis not present

## 2020-08-28 DIAGNOSIS — E119 Type 2 diabetes mellitus without complications: Secondary | ICD-10-CM | POA: Diagnosis not present

## 2020-08-28 DIAGNOSIS — E1149 Type 2 diabetes mellitus with other diabetic neurological complication: Secondary | ICD-10-CM | POA: Diagnosis not present

## 2020-08-28 DIAGNOSIS — I10 Essential (primary) hypertension: Secondary | ICD-10-CM | POA: Diagnosis not present

## 2020-08-28 DIAGNOSIS — E7800 Pure hypercholesterolemia, unspecified: Secondary | ICD-10-CM | POA: Diagnosis not present

## 2020-08-28 DIAGNOSIS — E1169 Type 2 diabetes mellitus with other specified complication: Secondary | ICD-10-CM | POA: Diagnosis not present

## 2020-08-28 DIAGNOSIS — E785 Hyperlipidemia, unspecified: Secondary | ICD-10-CM | POA: Diagnosis not present

## 2020-09-01 DIAGNOSIS — E139 Other specified diabetes mellitus without complications: Secondary | ICD-10-CM | POA: Diagnosis not present

## 2020-09-01 DIAGNOSIS — B353 Tinea pedis: Secondary | ICD-10-CM | POA: Diagnosis not present

## 2020-09-01 DIAGNOSIS — M19071 Primary osteoarthritis, right ankle and foot: Secondary | ICD-10-CM | POA: Diagnosis not present

## 2020-09-01 DIAGNOSIS — M2141 Flat foot [pes planus] (acquired), right foot: Secondary | ICD-10-CM | POA: Diagnosis not present

## 2020-09-22 DIAGNOSIS — I10 Essential (primary) hypertension: Secondary | ICD-10-CM | POA: Diagnosis not present

## 2020-09-23 DIAGNOSIS — I10 Essential (primary) hypertension: Secondary | ICD-10-CM | POA: Diagnosis not present

## 2020-09-29 DIAGNOSIS — I10 Essential (primary) hypertension: Secondary | ICD-10-CM | POA: Diagnosis not present

## 2020-09-29 DIAGNOSIS — E119 Type 2 diabetes mellitus without complications: Secondary | ICD-10-CM | POA: Diagnosis not present

## 2020-09-29 DIAGNOSIS — E1149 Type 2 diabetes mellitus with other diabetic neurological complication: Secondary | ICD-10-CM | POA: Diagnosis not present

## 2020-09-29 DIAGNOSIS — E1169 Type 2 diabetes mellitus with other specified complication: Secondary | ICD-10-CM | POA: Diagnosis not present

## 2020-09-29 DIAGNOSIS — E785 Hyperlipidemia, unspecified: Secondary | ICD-10-CM | POA: Diagnosis not present

## 2020-09-29 DIAGNOSIS — E7800 Pure hypercholesterolemia, unspecified: Secondary | ICD-10-CM | POA: Diagnosis not present

## 2020-10-23 DIAGNOSIS — I10 Essential (primary) hypertension: Secondary | ICD-10-CM | POA: Diagnosis not present

## 2020-10-28 DIAGNOSIS — E7800 Pure hypercholesterolemia, unspecified: Secondary | ICD-10-CM | POA: Diagnosis not present

## 2020-10-28 DIAGNOSIS — I10 Essential (primary) hypertension: Secondary | ICD-10-CM | POA: Diagnosis not present

## 2020-10-28 DIAGNOSIS — E119 Type 2 diabetes mellitus without complications: Secondary | ICD-10-CM | POA: Diagnosis not present

## 2020-10-28 DIAGNOSIS — E1169 Type 2 diabetes mellitus with other specified complication: Secondary | ICD-10-CM | POA: Diagnosis not present

## 2020-10-28 DIAGNOSIS — E1149 Type 2 diabetes mellitus with other diabetic neurological complication: Secondary | ICD-10-CM | POA: Diagnosis not present

## 2020-10-28 DIAGNOSIS — E785 Hyperlipidemia, unspecified: Secondary | ICD-10-CM | POA: Diagnosis not present

## 2020-11-20 DIAGNOSIS — B353 Tinea pedis: Secondary | ICD-10-CM | POA: Diagnosis not present

## 2020-11-20 DIAGNOSIS — M19071 Primary osteoarthritis, right ankle and foot: Secondary | ICD-10-CM | POA: Diagnosis not present

## 2020-11-20 DIAGNOSIS — E139 Other specified diabetes mellitus without complications: Secondary | ICD-10-CM | POA: Diagnosis not present

## 2020-11-20 DIAGNOSIS — M2141 Flat foot [pes planus] (acquired), right foot: Secondary | ICD-10-CM | POA: Diagnosis not present

## 2020-11-21 DIAGNOSIS — I10 Essential (primary) hypertension: Secondary | ICD-10-CM | POA: Diagnosis not present

## 2020-11-24 DIAGNOSIS — I10 Essential (primary) hypertension: Secondary | ICD-10-CM | POA: Diagnosis not present

## 2020-11-28 DIAGNOSIS — I10 Essential (primary) hypertension: Secondary | ICD-10-CM | POA: Diagnosis not present

## 2020-11-28 DIAGNOSIS — E1149 Type 2 diabetes mellitus with other diabetic neurological complication: Secondary | ICD-10-CM | POA: Diagnosis not present

## 2020-11-28 DIAGNOSIS — E785 Hyperlipidemia, unspecified: Secondary | ICD-10-CM | POA: Diagnosis not present

## 2020-11-28 DIAGNOSIS — E7800 Pure hypercholesterolemia, unspecified: Secondary | ICD-10-CM | POA: Diagnosis not present

## 2020-11-28 DIAGNOSIS — E1169 Type 2 diabetes mellitus with other specified complication: Secondary | ICD-10-CM | POA: Diagnosis not present

## 2020-11-28 DIAGNOSIS — E119 Type 2 diabetes mellitus without complications: Secondary | ICD-10-CM | POA: Diagnosis not present

## 2020-12-16 DIAGNOSIS — M2141 Flat foot [pes planus] (acquired), right foot: Secondary | ICD-10-CM | POA: Diagnosis not present

## 2020-12-16 DIAGNOSIS — M2142 Flat foot [pes planus] (acquired), left foot: Secondary | ICD-10-CM | POA: Diagnosis not present

## 2020-12-17 DIAGNOSIS — I10 Essential (primary) hypertension: Secondary | ICD-10-CM | POA: Diagnosis not present

## 2020-12-24 DIAGNOSIS — E7800 Pure hypercholesterolemia, unspecified: Secondary | ICD-10-CM | POA: Diagnosis not present

## 2020-12-24 DIAGNOSIS — E119 Type 2 diabetes mellitus without complications: Secondary | ICD-10-CM | POA: Diagnosis not present

## 2020-12-24 DIAGNOSIS — E1169 Type 2 diabetes mellitus with other specified complication: Secondary | ICD-10-CM | POA: Diagnosis not present

## 2020-12-24 DIAGNOSIS — E1149 Type 2 diabetes mellitus with other diabetic neurological complication: Secondary | ICD-10-CM | POA: Diagnosis not present

## 2020-12-24 DIAGNOSIS — I10 Essential (primary) hypertension: Secondary | ICD-10-CM | POA: Diagnosis not present

## 2020-12-24 DIAGNOSIS — E785 Hyperlipidemia, unspecified: Secondary | ICD-10-CM | POA: Diagnosis not present

## 2021-01-13 DIAGNOSIS — M2141 Flat foot [pes planus] (acquired), right foot: Secondary | ICD-10-CM | POA: Diagnosis not present

## 2021-01-13 DIAGNOSIS — M2142 Flat foot [pes planus] (acquired), left foot: Secondary | ICD-10-CM | POA: Diagnosis not present

## 2021-01-26 DIAGNOSIS — E1149 Type 2 diabetes mellitus with other diabetic neurological complication: Secondary | ICD-10-CM | POA: Diagnosis not present

## 2021-01-26 DIAGNOSIS — E7800 Pure hypercholesterolemia, unspecified: Secondary | ICD-10-CM | POA: Diagnosis not present

## 2021-01-26 DIAGNOSIS — E1169 Type 2 diabetes mellitus with other specified complication: Secondary | ICD-10-CM | POA: Diagnosis not present

## 2021-01-26 DIAGNOSIS — E785 Hyperlipidemia, unspecified: Secondary | ICD-10-CM | POA: Diagnosis not present

## 2021-01-26 DIAGNOSIS — E119 Type 2 diabetes mellitus without complications: Secondary | ICD-10-CM | POA: Diagnosis not present

## 2021-01-26 DIAGNOSIS — I10 Essential (primary) hypertension: Secondary | ICD-10-CM | POA: Diagnosis not present

## 2021-02-20 DIAGNOSIS — I10 Essential (primary) hypertension: Secondary | ICD-10-CM | POA: Diagnosis not present

## 2021-02-24 DIAGNOSIS — M545 Low back pain, unspecified: Secondary | ICD-10-CM | POA: Diagnosis not present

## 2021-02-24 DIAGNOSIS — M5416 Radiculopathy, lumbar region: Secondary | ICD-10-CM | POA: Diagnosis not present

## 2021-02-25 ENCOUNTER — Other Ambulatory Visit: Payer: Self-pay | Admitting: Student

## 2021-02-25 DIAGNOSIS — M5416 Radiculopathy, lumbar region: Secondary | ICD-10-CM

## 2021-03-14 ENCOUNTER — Ambulatory Visit
Admission: RE | Admit: 2021-03-14 | Discharge: 2021-03-14 | Disposition: A | Discharge status: Home / Self Care | Payer: BLUE CROSS/BLUE SHIELD | Source: Ambulatory Visit | Attending: Student | Admitting: Student

## 2021-03-14 DIAGNOSIS — M4727 Other spondylosis with radiculopathy, lumbosacral region: Secondary | ICD-10-CM | POA: Diagnosis not present

## 2021-03-14 DIAGNOSIS — M4186 Other forms of scoliosis, lumbar region: Secondary | ICD-10-CM | POA: Diagnosis not present

## 2021-03-14 DIAGNOSIS — M5416 Radiculopathy, lumbar region: Secondary | ICD-10-CM

## 2021-03-14 DIAGNOSIS — M48061 Spinal stenosis, lumbar region without neurogenic claudication: Secondary | ICD-10-CM | POA: Diagnosis not present

## 2021-03-14 DIAGNOSIS — N281 Cyst of kidney, acquired: Secondary | ICD-10-CM | POA: Diagnosis not present

## 2021-03-14 MED ORDER — GADOBENATE DIMEGLUMINE 529 MG/ML IV SOLN
20.0000 mL | Freq: Once | INTRAVENOUS | Status: AC | PRN
  Administered 2021-03-14: 20 mL via INTRAVENOUS

## 2021-03-24 DIAGNOSIS — I10 Essential (primary) hypertension: Secondary | ICD-10-CM | POA: Diagnosis not present

## 2021-04-07 DIAGNOSIS — M5416 Radiculopathy, lumbar region: Secondary | ICD-10-CM | POA: Diagnosis not present

## 2021-04-08 DIAGNOSIS — Z6829 Body mass index (BMI) 29.0-29.9, adult: Secondary | ICD-10-CM | POA: Diagnosis not present

## 2021-04-08 DIAGNOSIS — E119 Type 2 diabetes mellitus without complications: Secondary | ICD-10-CM | POA: Diagnosis not present

## 2021-04-08 DIAGNOSIS — M48062 Spinal stenosis, lumbar region with neurogenic claudication: Secondary | ICD-10-CM | POA: Diagnosis not present

## 2021-04-08 DIAGNOSIS — M5416 Radiculopathy, lumbar region: Secondary | ICD-10-CM | POA: Diagnosis not present

## 2021-04-22 DIAGNOSIS — I10 Essential (primary) hypertension: Secondary | ICD-10-CM | POA: Diagnosis not present

## 2021-05-19 DIAGNOSIS — Z79899 Other long term (current) drug therapy: Secondary | ICD-10-CM | POA: Diagnosis not present

## 2021-05-19 DIAGNOSIS — F439 Reaction to severe stress, unspecified: Secondary | ICD-10-CM | POA: Diagnosis not present

## 2021-05-19 DIAGNOSIS — E7800 Pure hypercholesterolemia, unspecified: Secondary | ICD-10-CM | POA: Diagnosis not present

## 2021-05-19 DIAGNOSIS — I10 Essential (primary) hypertension: Secondary | ICD-10-CM | POA: Diagnosis not present

## 2021-05-19 DIAGNOSIS — Z7984 Long term (current) use of oral hypoglycemic drugs: Secondary | ICD-10-CM | POA: Diagnosis not present

## 2021-05-19 DIAGNOSIS — E1169 Type 2 diabetes mellitus with other specified complication: Secondary | ICD-10-CM | POA: Diagnosis not present

## 2021-05-19 DIAGNOSIS — M545 Low back pain, unspecified: Secondary | ICD-10-CM | POA: Diagnosis not present

## 2021-05-19 LAB — LIPID PANEL
Cholesterol: 149 (ref 0–200)
HDL: 37 (ref 35–70)
LDL Cholesterol: 68
Triglycerides: 269 — AB (ref 40–160)

## 2021-05-19 LAB — HEMOGLOBIN A1C: Hemoglobin A1C: 10.8

## 2021-05-19 LAB — BASIC METABOLIC PANEL
BUN: 21 (ref 4–21)
Creatinine: 1.1 (ref 0.6–1.3)

## 2021-05-21 DIAGNOSIS — E1169 Type 2 diabetes mellitus with other specified complication: Secondary | ICD-10-CM | POA: Diagnosis not present

## 2021-05-21 DIAGNOSIS — E785 Hyperlipidemia, unspecified: Secondary | ICD-10-CM | POA: Diagnosis not present

## 2021-05-21 DIAGNOSIS — E119 Type 2 diabetes mellitus without complications: Secondary | ICD-10-CM | POA: Diagnosis not present

## 2021-05-21 DIAGNOSIS — E1149 Type 2 diabetes mellitus with other diabetic neurological complication: Secondary | ICD-10-CM | POA: Diagnosis not present

## 2021-05-21 DIAGNOSIS — I10 Essential (primary) hypertension: Secondary | ICD-10-CM | POA: Diagnosis not present

## 2021-05-21 DIAGNOSIS — E7800 Pure hypercholesterolemia, unspecified: Secondary | ICD-10-CM | POA: Diagnosis not present

## 2021-05-22 DIAGNOSIS — I10 Essential (primary) hypertension: Secondary | ICD-10-CM | POA: Diagnosis not present

## 2021-05-27 DIAGNOSIS — I10 Essential (primary) hypertension: Secondary | ICD-10-CM | POA: Diagnosis not present

## 2021-05-27 DIAGNOSIS — E119 Type 2 diabetes mellitus without complications: Secondary | ICD-10-CM | POA: Diagnosis not present

## 2021-05-27 DIAGNOSIS — E785 Hyperlipidemia, unspecified: Secondary | ICD-10-CM | POA: Diagnosis not present

## 2021-05-27 DIAGNOSIS — E1149 Type 2 diabetes mellitus with other diabetic neurological complication: Secondary | ICD-10-CM | POA: Diagnosis not present

## 2021-05-27 DIAGNOSIS — E1169 Type 2 diabetes mellitus with other specified complication: Secondary | ICD-10-CM | POA: Diagnosis not present

## 2021-05-27 DIAGNOSIS — E7800 Pure hypercholesterolemia, unspecified: Secondary | ICD-10-CM | POA: Diagnosis not present

## 2021-06-23 DIAGNOSIS — I10 Essential (primary) hypertension: Secondary | ICD-10-CM | POA: Diagnosis not present

## 2021-06-30 DIAGNOSIS — M5416 Radiculopathy, lumbar region: Secondary | ICD-10-CM | POA: Diagnosis not present

## 2021-07-07 ENCOUNTER — Encounter: Payer: Self-pay | Admitting: "Endocrinology

## 2021-07-07 ENCOUNTER — Ambulatory Visit: Payer: PPO | Admitting: "Endocrinology

## 2021-07-07 ENCOUNTER — Other Ambulatory Visit: Payer: Self-pay

## 2021-07-07 VITALS — BP 98/60 | HR 72 | Ht 69.5 in | Wt 202.2 lb

## 2021-07-07 DIAGNOSIS — I10 Essential (primary) hypertension: Secondary | ICD-10-CM

## 2021-07-07 DIAGNOSIS — E1165 Type 2 diabetes mellitus with hyperglycemia: Secondary | ICD-10-CM | POA: Diagnosis not present

## 2021-07-07 DIAGNOSIS — E782 Mixed hyperlipidemia: Secondary | ICD-10-CM | POA: Diagnosis not present

## 2021-07-07 MED ORDER — EMPAGLIFLOZIN 10 MG PO TABS: 10.0000 mg | ORAL_TABLET | Freq: Every day | ORAL | 1 refills | Status: DC

## 2021-07-07 MED ORDER — OZEMPIC (1 MG/DOSE) 4 MG/3ML ~~LOC~~ SOPN: 1.0000 mg | PEN_INJECTOR | SUBCUTANEOUS | 1 refills | Status: DC

## 2021-07-07 NOTE — Progress Notes (Signed)
Endocrinology Consult Note       07/07/2021, 5:14 PM   Subjective:    Patient ID: Mario [name], male    DOB: Jun 14, 1951.  Mario [name] is being seen in consultation for management of currently uncontrolled symptomatic diabetes requested by  Rodena Medin, MD.   Past Medical History:  Diagnosis Date   Chronic back pain    radiculopatly  and stenosis   Complication of anesthesia    woke up being very agitated   Diabetes mellitus without complication ([place])    takes Metformin and Amaryl daily   History of gout 43yr ago   History of kidney stones 30+yrs ago   History of shingles 1990   Hyperlipidemia    takes Atorvastatin daily   Rosacea    uses Metronidazole daily    Past Surgical History:  Procedure Laterality Date   CERVICAL FUSION  1998   COLONOSCOPY     LUMBAR LAMINECTOMY/DECOMPRESSION MICRODISCECTOMY N/A 04/15/2014   Procedure: LUMBAR LAMINECTOMY/DECOMPRESSION MICRODISCECTOMY 2 LEVELS  lumbar three/four, four/five  ;  Surgeon: JOphelia Charter MD;  [facility];  Service: Neurosurgery;  Laterality: N/A;   TONSILLECTOMY  1958    Social History   Socioeconomic History   Marital status: Married    Spouse name: Not on file   Number of children: Not on file   Years of education: Not on file   Highest education level: Not on file  Occupational History   Not on file  Tobacco Use   Smoking status: Never   Smokeless tobacco: Not on file  Substance and Sexual Activity   Alcohol use: No   Drug use: No   Sexual activity: Yes  Other Topics Concern   Not on file  Social History Narrative   Not on file   Social Determinants of Health   Financial Resource Strain: Not on file  Food Insecurity: Not on file  Transportation Needs: Not on file  Physical Activity: Not on file  Stress: Not on file  Social Connections: Not on file    Family History  Problem Relation Age of Onset    Hypertension Mother    Diabetes Mother    Hyperlipidemia Mother    Heart attack Mother    Heart failure Mother    Cancer Father    Heart attack Father    Hyperlipidemia Father    Hypertension Father     Outpatient Encounter Medications as of 07/07/2021  Medication Sig   empagliflozin (JARDIANCE) 10 MG TABS tablet Take 1 tablet (10 mg total) by mouth daily before breakfast.   Semaglutide, 1 MG/DOSE, (OZEMPIC, 1 MG/DOSE,) 4 MG/3ML SOPN Inject 1 mg into the skin once a week.   aspirin EC 325 MG tablet Take 325 mg by mouth daily.   atorvastatin (LIPITOR) 40 MG tablet Take 40 mg by mouth at bedtime.   diazepam (VALIUM) 5 MG tablet Take 1 tablet (5 mg total) by mouth every 6 (six) hours as needed for muscle spasms. (Patient not taking: Reported on 07/07/2021)   docusate sodium 100 MG CAPS Take 100 mg by mouth 2 (two) times daily. (Patient not  taking: Reported on 07/07/2021)   gabapentin (NEURONTIN) 100 MG capsule Take 1 capsule (100 mg total) by mouth 3 (three) times daily. (Patient not taking: Reported on 07/07/2021)   hydrochlorothiazide (MICROZIDE) 12.5 MG capsule Take 12.5 mg by mouth every morning.   Ibuprofen-Diphenhydramine Cit (ADVIL PM PO) Take 2 tablets by mouth at bedtime.   metFORMIN (GLUCOPHAGE) 1000 MG tablet Take 1,000 mg by mouth 2 (two) times daily with a meal.   metroNIDAZOLE (METROCREAM) 0.75 % cream Apply 1 application topically 2 (two) times daily. To face   Multiple Vitamins-Minerals (OCUVITE PO) Take 1 tablet by mouth daily. (Patient not taking: Reported on 07/07/2021)   olmesartan (BENICAR) 20 MG tablet Take 40 mg by mouth daily.   Omega-3 Fatty Acids (FISH OIL) 1200 MG CAPS Take 2,400 mg by mouth daily.   oxyCODONE-acetaminophen (PERCOCET) 10-325 MG per tablet Take 1 tablet by mouth every 4 (four) hours as needed for pain. (Patient not taking: Reported on 07/07/2021)   [DISCONTINUED] benazepril (LOTENSIN) 5 MG tablet Take 5 mg by mouth daily. (Patient not taking: Reported  on 07/07/2021)   [DISCONTINUED] glimepiride (AMARYL) 2 MG tablet Take 2 mg by mouth 2 (two) times daily. (Patient not taking: Reported on 07/07/2021)   [DISCONTINUED] JARDIANCE 25 MG TABS tablet Take 25 mg by mouth every morning.   [DISCONTINUED] OZEMPIC, 0.25 OR 0.5 MG/DOSE, 2 MG/1.5ML SOPN Inject 0.5 mg into the skin once a week.   No facility-administered encounter medications on file as of 07/07/2021.    ALLERGIES: Allergies  Allergen Reactions   Adhesive [Tape]     Breaks out    VACCINATION STATUS:  There is no immunization history on file for this patient.  Diabetes He presents for his initial diabetic visit. He has type 2 diabetes mellitus. Onset time: He was diagnosed at approximate age of 70 years. There are no hypoglycemic associated symptoms. Pertinent negatives for hypoglycemia include no confusion, headaches, pallor or seizures. Associated symptoms include polydipsia and polyuria. Pertinent negatives for diabetes include no chest pain, no fatigue, no polyphagia and no weakness. There are no hypoglycemic complications. Symptoms are improving. There are no diabetic complications. Risk factors for coronary artery disease include diabetes mellitus, dyslipidemia, hypertension, male sex and family history. Current diabetic treatments: He is currently on Ozempic 0.5 mg weekly, Jardiance 25 mg p.o. daily, and metformin 1000 mg p.o. twice daily. His weight is decreasing steadily (Patient gives history of weighing as much as 300 pounds, progressively lost close to 100 pounds.). He is following a generally unhealthy diet. When asked about meal planning, he reported none. His breakfast blood glucose range is generally 140-180 mg/dl. His overall blood glucose range is 140-180 mg/dl. (He brings in his phone which has his blood glucose readings.  He monitors mostly at fasting.  Current profile ranges between 134-186.  Recent A1c was 10.8%.) An ACE inhibitor/angiotensin II receptor blocker is being  taken. Eye exam is current.  Hyperlipidemia This is a chronic problem. The problem is uncontrolled. Exacerbating diseases include diabetes. Pertinent negatives include no chest pain, myalgias or shortness of breath. Current antihyperlipidemic treatment includes statins. Risk factors for coronary artery disease include diabetes mellitus, dyslipidemia, hypertension, male sex and a sedentary lifestyle.  Hypertension This is a chronic problem. The problem is controlled. Pertinent negatives include no chest pain, headaches, neck pain, palpitations or shortness of breath. Risk factors for coronary artery disease include dyslipidemia, diabetes mellitus, family history, male gender and sedentary lifestyle. Past treatments include diuretics and angiotensin blockers.  Review of Systems  Constitutional:  Negative for chills, fatigue, fever and unexpected weight change.  HENT:  Negative for dental problem, mouth sores and trouble swallowing.   Eyes:  Negative for visual disturbance.  Respiratory:  Negative for cough, choking, chest tightness, shortness of breath and wheezing.   Cardiovascular:  Negative for chest pain, palpitations and leg swelling.  Gastrointestinal:  Negative for abdominal distention, abdominal pain, constipation, diarrhea, nausea and vomiting.  Endocrine: Positive for polydipsia and polyuria. Negative for polyphagia.  Genitourinary:  Negative for dysuria, flank pain, hematuria and urgency.  Musculoskeletal:  Negative for back pain, gait problem, myalgias and neck pain.  Skin:  Negative for pallor, rash and wound.  Neurological:  Negative for seizures, syncope, weakness, numbness and headaches.  Psychiatric/Behavioral:  Negative for confusion and dysphoric mood.    Objective:    Vitals with BMI 07/07/2021 05/29/2020 04/16/2014  Height 5' 9.5" - -  Weight 202 lbs 3 oz - -  BMI [id number] - -  Systolic 98 123456 88  Diastolic 60 76 32  Pulse 72 69 88    BP 98/60   Pulse 72   Ht 5' 9.5"  (1.765 m)   Wt 202 lb 3.2 oz (91.7 kg)   BMI 29.43 kg/m   Wt Readings from Last 3 Encounters:  07/07/21 202 lb 3.2 oz (91.7 kg)  04/15/14 219 lb 14.4 oz (99.7 kg)  04/05/14 219 lb 14.4 oz (99.7 kg)     Physical Exam Constitutional:      General: He is not in acute distress.    Appearance: He is well-developed.  HENT:     Head: Normocephalic and atraumatic.  Neck:     Thyroid: No thyromegaly.     Trachea: No tracheal deviation.  Cardiovascular:     Rate and Rhythm: Normal rate.     Pulses:          Dorsalis pedis pulses are 1+ on the right side and 1+ on the left side.       Posterior tibial pulses are 1+ on the right side and 1+ on the left side.     Heart sounds: Normal heart sounds, S1 normal and S2 normal. No murmur heard.   No gallop.  Pulmonary:     Effort: Pulmonary effort is normal. No respiratory distress.     Breath sounds: Normal breath sounds. No wheezing.  Abdominal:     General: Bowel sounds are normal. There is no distension.     Palpations: Abdomen is soft.     Tenderness: There is no abdominal tenderness. There is no guarding.  Musculoskeletal:     Right shoulder: No swelling or deformity.     Cervical back: Normal range of motion and neck supple.  Skin:    General: Skin is warm and dry.     Findings: No rash.     Nails: There is no clubbing.  Neurological:     Mental Status: He is alert and oriented to person, place, and time.     Cranial Nerves: No cranial nerve deficit.     Sensory: No sensory deficit.     Gait: Gait normal.     Deep Tendon Reflexes: Reflexes are normal and symmetric.  Psychiatric:        Speech: Speech normal.        Behavior: Behavior normal. Behavior is cooperative.        Thought Content: Thought content normal.        Judgment: Judgment normal.  CMP ( most recent) CMP     Component Value Date/Time   NA 146 04/05/2014 0908   K 4.4 04/05/2014 0908   CL 109 04/05/2014 0908   CO2 25 04/05/2014 0908   GLUCOSE 106  (H) 04/05/2014 0908   BUN 21 05/19/2021 0000   CREATININE 1.1 05/19/2021 0000   CREATININE 0.87 04/05/2014 0908   CALCIUM 9.9 04/05/2014 0908   GFRNONAA >90 04/05/2014 0908   GFRAA >90 04/05/2014 0908     Diabetic Labs (most recent): Lab Results  Component Value Date   HGBA1C 10.8 05/19/2021     Lipid Panel ( most recent) Lipid Panel     Component Value Date/Time   CHOL 149 05/19/2021 0000   TRIG 269 (A) 05/19/2021 0000   HDL 37 05/19/2021 0000   LDLCALC 68 05/19/2021 0000        Assessment & Plan:   Uncontrolled type 2 diabetes  - Mario [name] has currently uncontrolled symptomatic type 2 DM since  70 years of age,  with most recent A1c of 10.8 %. Recent labs reviewed. - I had a long discussion with him about the progressive nature of diabetes and the pathology behind its complications. -He does not report any gross complications, however he remains at a high risk for more acute and chronic complications which include CAD, CVA, CKD, retinopathy, and neuropathy. These are all discussed in detail with him.  - I have counseled him on diet  and weight management  by adopting a carbohydrate restricted/protein rich diet. Patient is encouraged to switch to  unprocessed or minimally processed     complex starch and increased protein intake (animal or plant source), fruits, and vegetables. -  he is advised to stick to a routine mealtimes to eat 3 meals  a day and avoid unnecessary snacks ( to snack only to correct hypoglycemia).   - he acknowledges that there is a room for improvement in his food and drink choices. - Suggestion is made for him to avoid simple carbohydrates  from his diet including Cakes, Sweet Desserts, Ice Cream, Soda (diet and regular), Sweet Tea, Candies, Chips, Cookies, Store Bought Juices, Alcohol in Excess of  1-2 drinks a day, Artificial Sweeteners,  Coffee Creamer, and "Sugar-free" Products. This will help patient to have more stable blood glucose profile  and potentially avoid unintended weight gain.  - he will be scheduled with Jearld Fenton, RDN, CDE for diabetes education.  - I have approached him with the following individualized plan to manage  his diabetes and patient agrees:   -Evidently, he is responding to his recent addition of Ozempic with near target fasting glycemic profile.   -He will not be considered for insulin treatment for today.  He is willing and advised to monitor blood glucose twice a day-daily before breakfast and at bedtime until he returns for a follow-up visit in 6 weeks.    -In the meantime, he is advised to increase Ozempic to 1 mg subcutaneously weekly, continue metformin 1000 mg p.o. twice daily, and lower Jardiance to 10 mg p.o. once a day at breakfast.   - he is encouraged to call clinic for blood glucose levels less than 70 or above 200 mg /dl at fasting. -He will be considered for basal insulin if he returns with significant hyperglycemia. - Specific targets for  A1c;  LDL, HDL,  and Triglycerides were discussed with the patient.  2) Blood Pressure /Hypertension:  his blood pressure is tightly controlled to target.  he is advised to continue his current medications including hydrochlorothiazide 12.5 mg p.o. daily mg p.o. daily with breakfast .  He is also on Benicar.  Since he would benefit from Benicar, he will be advised to discontinue hydrochlorothiazide if he returns with marginal blood pressure. 3) Lipids/Hyperlipidemia:   Review of his recent lipid panel showed uncontrolled hypertriglyceridemia at 269, controlled LDL at 68.  He is advised to continue atorvastatin 40 mg p.o. nightly.  Side effects and precautions discussed with him.     Side effects and precautions discussed with him.  4)  Weight/Diet:  Body mass index is 29.43 kg/m.  -   Patient is status post losing 100 pounds progressively.   he is still  a candidate for  some more weight loss. I discussed with him the fact that loss of 5 - 10% of his   current body weight will have the most impact on his diabetes management.  Exercise, and detailed carbohydrates information provided  -  detailed on discharge instructions.  5) Chronic Care/Health Maintenance:  -he  is on ACEI/ARB and Statin medications and  is encouraged to initiate and continue to follow up with Ophthalmology, Dentist,  Podiatrist at least yearly or according to recommendations, and advised to   stay away from smoking. I have recommended yearly flu vaccine and pneumonia vaccine at least every 5 years; moderate intensity exercise for up to 150 minutes weekly; and  sleep for at least 7 hours a day.  - he is  advised to maintain close follow up with Rodena Medin, MD for primary care needs, as well as his other providers for optimal and coordinated care.   I spent 65 minutes in the care of the patient today including review of labs from [facility], Lipids, Thyroid Function, Hematology (current and previous including abstractions from other facilities); face-to-face time discussing  his blood glucose readings/logs, discussing hypoglycemia and hyperglycemia episodes and symptoms, medications doses, his options of short and long term treatment based on the latest standards of care / guidelines;  discussion about incorporating lifestyle medicine;  and documenting the encounter.     Please refer to Patient Instructions for Blood Glucose Monitoring and Insulin/Medications Dosing Guide"  in media tab for additional information. Please  also refer to " Patient Self Inventory" in the Media  tab for reviewed elements of pertinent patient history.  Mario [name] participated in the discussions, expressed understanding, and voiced agreement with the above plans.  All questions were answered to his satisfaction. he is encouraged to contact clinic should he have any questions or concerns prior to his return visit.   Follow up plan: - Return in about 6 weeks (around 08/18/2021) for Bring Meter and Logs-  A1c in Office.  Glade Lloyd, MD [facility] [address] [place], [place] [address] Phone: [phone]  Fax: [phone]    07/07/2021, 5:14 PM  This note was partially dictated with voice recognition software. Similar sounding words can be transcribed inadequately or may not  be corrected upon review.

## 2021-07-07 NOTE — Patient Instructions (Signed)

## 2021-08-05 ENCOUNTER — Telehealth: Payer: Self-pay | Admitting: "Endocrinology

## 2021-08-05 DIAGNOSIS — E1165 Type 2 diabetes mellitus with hyperglycemia: Secondary | ICD-10-CM

## 2021-08-05 MED ORDER — FREESTYLE LIBRE 2 SENSOR MISC: 2 refills | Status: DC

## 2021-08-05 MED ORDER — FREESTYLE LIBRE 2 READER DEVI: 1.0000 | 0 refills | Status: DC

## 2021-08-05 NOTE — Telephone Encounter (Signed)
Pt called and said his insurance will cover him getting the freestyle libre and wants to know if that can be sent in.  [facility] [phone] - [place], [address] Phone:  [phone]  Fax:  [phone]

## 2021-08-05 NOTE — Telephone Encounter (Signed)
Rx sent 

## 2021-08-18 ENCOUNTER — Ambulatory Visit: Payer: PPO | Admitting: "Endocrinology

## 2021-08-21 DIAGNOSIS — Z85828 Personal history of other malignant neoplasm of skin: Secondary | ICD-10-CM | POA: Diagnosis not present

## 2021-08-21 DIAGNOSIS — Z7984 Long term (current) use of oral hypoglycemic drugs: Secondary | ICD-10-CM | POA: Diagnosis not present

## 2021-08-21 DIAGNOSIS — M5416 Radiculopathy, lumbar region: Secondary | ICD-10-CM | POA: Diagnosis not present

## 2021-08-21 DIAGNOSIS — Z7985 Long-term (current) use of injectable non-insulin antidiabetic drugs: Secondary | ICD-10-CM | POA: Diagnosis not present

## 2021-08-21 DIAGNOSIS — Z1389 Encounter for screening for other disorder: Secondary | ICD-10-CM | POA: Diagnosis not present

## 2021-08-21 DIAGNOSIS — I10 Essential (primary) hypertension: Secondary | ICD-10-CM | POA: Diagnosis not present

## 2021-08-21 DIAGNOSIS — E7800 Pure hypercholesterolemia, unspecified: Secondary | ICD-10-CM | POA: Diagnosis not present

## 2021-08-21 DIAGNOSIS — Z125 Encounter for screening for malignant neoplasm of prostate: Secondary | ICD-10-CM | POA: Diagnosis not present

## 2021-08-21 DIAGNOSIS — F439 Reaction to severe stress, unspecified: Secondary | ICD-10-CM | POA: Diagnosis not present

## 2021-08-21 DIAGNOSIS — E1169 Type 2 diabetes mellitus with other specified complication: Secondary | ICD-10-CM | POA: Diagnosis not present

## 2021-08-21 DIAGNOSIS — L719 Rosacea, unspecified: Secondary | ICD-10-CM | POA: Diagnosis not present

## 2021-08-21 DIAGNOSIS — Z0000 Encounter for general adult medical examination without abnormal findings: Secondary | ICD-10-CM | POA: Diagnosis not present

## 2021-08-21 LAB — LIPID PANEL
Cholesterol: 120 (ref 0–200)
HDL: 37 (ref 35–70)
LDL Cholesterol: 55
Triglycerides: 162 — AB (ref 40–160)

## 2021-08-21 LAB — HEMOGLOBIN A1C: Hemoglobin A1C: 8.7

## 2021-08-24 DIAGNOSIS — I10 Essential (primary) hypertension: Secondary | ICD-10-CM | POA: Diagnosis not present

## 2021-08-26 ENCOUNTER — Other Ambulatory Visit: Payer: Self-pay

## 2021-08-26 ENCOUNTER — Ambulatory Visit: Payer: PPO | Admitting: "Endocrinology

## 2021-08-26 ENCOUNTER — Encounter: Payer: Self-pay | Admitting: "Endocrinology

## 2021-08-26 VITALS — BP 110/76 | HR 72 | Ht 69.5 in | Wt 189.8 lb

## 2021-08-26 DIAGNOSIS — E782 Mixed hyperlipidemia: Secondary | ICD-10-CM | POA: Diagnosis not present

## 2021-08-26 DIAGNOSIS — E1165 Type 2 diabetes mellitus with hyperglycemia: Secondary | ICD-10-CM

## 2021-08-26 DIAGNOSIS — I10 Essential (primary) hypertension: Secondary | ICD-10-CM | POA: Diagnosis not present

## 2021-08-26 NOTE — Patient Instructions (Signed)

## 2021-08-26 NOTE — Progress Notes (Signed)
08/26/2021, 9:01 AM  Endocrinology follow-up note   Subjective:    Patient ID: Mario [name], male    DOB: 03/17/1951.  Mario [name] is being seen in follow-up after he was seen in consultation for  management of currently uncontrolled symptomatic diabetes requested by  Mario Medin, MD.   Past Medical History:  Diagnosis Date   Chronic back pain    radiculopatly  and stenosis   Complication of anesthesia    woke up being very agitated   Diabetes mellitus without complication ([place])    takes Metformin and Amaryl daily   History of gout 87yrs ago   History of kidney stones 30+yrs ago   History of shingles 1990   Hyperlipidemia    takes Atorvastatin daily   Rosacea    uses Metronidazole daily    Past Surgical History:  Procedure Laterality Date   CERVICAL FUSION  1998   COLONOSCOPY     LUMBAR LAMINECTOMY/DECOMPRESSION MICRODISCECTOMY N/A 04/15/2014   Procedure: LUMBAR LAMINECTOMY/DECOMPRESSION MICRODISCECTOMY 2 LEVELS  lumbar three/four, four/five  ;  Surgeon: Mario Charter, MD;  Location: [facility] [facility];  Service: Neurosurgery;  Laterality: N/A;   TONSILLECTOMY  1958    Social History   Socioeconomic History   Marital status: Married    Spouse name: Not on file   Number of children: Not on file   Years of education: Not on file   Highest education level: Not on file  Occupational History   Not on file  Tobacco Use   Smoking status: Never   Smokeless tobacco: Not on file  Substance and Sexual Activity   Alcohol use: No   Drug use: No   Sexual activity: Yes  Other Topics Concern   Not on file  Social History Narrative   Not on file   Social Determinants of Health   Financial Resource Strain: Not on file  Food Insecurity: Not on file  Transportation Needs: Not on file  Physical Activity: Not on file  Stress: Not on file  Social Connections: Not on file    Family History   Problem Relation Age of Onset   Hypertension Mother    Diabetes Mother    Hyperlipidemia Mother    Heart attack Mother    Heart failure Mother    Cancer Father    Heart attack Father    Hyperlipidemia Father    Hypertension Father     Outpatient Encounter Medications as of 08/26/2021  Medication Sig   aspirin EC 325 MG tablet Take 325 mg by mouth daily.   atorvastatin (LIPITOR) 40 MG tablet Take 40 mg by mouth at bedtime.   Continuous Blood Gluc Receiver (FREESTYLE LIBRE 2 READER) DEVI 1 each by Does not apply route as directed. Use to check glucose as directed   Continuous Blood Gluc Sensor (FREESTYLE LIBRE 2 SENSOR) MISC Change sensor every 14 days.   empagliflozin (JARDIANCE) 10 MG TABS tablet Take 1 tablet (10 mg total) by mouth daily before breakfast.   hydrochlorothiazide (MICROZIDE) 12.5 MG capsule Take 12.5 mg by mouth every morning.   Ibuprofen-Diphenhydramine Cit (ADVIL PM PO) Take 2 tablets  by mouth at bedtime.   metFORMIN (GLUCOPHAGE) 1000 MG tablet Take 1,000 mg by mouth 2 (two) times daily with a meal.   metroNIDAZOLE (METROCREAM) 0.75 % cream Apply 1 application topically 2 (two) times daily. To face   olmesartan (BENICAR) 20 MG tablet Take 40 mg by mouth daily.   Omega-3 Fatty Acids (FISH OIL) 1200 MG CAPS Take 2,400 mg by mouth daily.   Semaglutide, 1 MG/DOSE, (OZEMPIC, 1 MG/DOSE,) 4 MG/3ML SOPN Inject 1 mg into the skin once a week.   [DISCONTINUED] diazepam (VALIUM) 5 MG tablet Take 1 tablet (5 mg total) by mouth every 6 (six) hours as needed for muscle spasms. (Patient not taking: Reported on 07/07/2021)   [DISCONTINUED] docusate sodium 100 MG CAPS Take 100 mg by mouth 2 (two) times daily. (Patient not taking: Reported on 07/07/2021)   [DISCONTINUED] gabapentin (NEURONTIN) 100 MG capsule Take 1 capsule (100 mg total) by mouth 3 (three) times daily. (Patient not taking: Reported on 07/07/2021)   [DISCONTINUED] Multiple Vitamins-Minerals (OCUVITE PO) Take 1 tablet by  mouth daily. (Patient not taking: Reported on 07/07/2021)   [DISCONTINUED] oxyCODONE-acetaminophen (PERCOCET) 10-325 MG per tablet Take 1 tablet by mouth every 4 (four) hours as needed for pain. (Patient not taking: Reported on 07/07/2021)   No facility-administered encounter medications on file as of 08/26/2021.    ALLERGIES: Allergies  Allergen Reactions   Adhesive [Tape]     Breaks out    VACCINATION STATUS:  There is no immunization history on file for this patient.  Diabetes He presents for his follow-up diabetic visit. He has type 2 diabetes mellitus. Onset time: He was diagnosed at approximate age of 8 years. His disease course has been improving. There are no hypoglycemic associated symptoms. Pertinent negatives for hypoglycemia include no confusion, headaches, pallor or seizures. Pertinent negatives for diabetes include no chest pain, no fatigue, no polydipsia, no polyphagia, no polyuria and no weakness. There are no hypoglycemic complications. Symptoms are improving. There are no diabetic complications. Risk factors for coronary artery disease include diabetes mellitus, dyslipidemia, hypertension, male sex and family history. Current diabetic treatments: He is currently on Ozempic 0.5 mg weekly, Jardiance 25 mg p.o. daily, and metformin 1000 mg p.o. twice daily. His weight is decreasing steadily (Patient gives history of weighing as much as 300 pounds, progressively lost close to 100 pounds.  He continues to lose 30 pounds since his last visit.). When asked about meal planning, he reported none. His home blood glucose trend is decreasing steadily. His breakfast blood glucose range is generally 140-180 mg/dl. His overall blood glucose range is 140-180 mg/dl. Mario [name] presents with his CGM device showing average blood glucose of 166 over the last 7 days.  He has 73% time range, 23% above range, no significant hypoglycemia.  He is recent A1c was 8.7%, improving from 10.8% during his last visit.    ) An ACE inhibitor/angiotensin II receptor blocker is being taken. Eye exam is current.  Hyperlipidemia This is a chronic problem. The problem is uncontrolled. Exacerbating diseases include diabetes. Pertinent negatives include no chest pain, myalgias or shortness of breath. Current antihyperlipidemic treatment includes statins. Risk factors for coronary artery disease include diabetes mellitus, dyslipidemia, hypertension, male sex and a sedentary lifestyle.  Hypertension This is a chronic problem. The problem is controlled. Pertinent negatives include no chest pain, headaches, neck pain, palpitations or shortness of breath. Risk factors for coronary artery disease include dyslipidemia, diabetes mellitus, family history, male gender and sedentary lifestyle. Past treatments  include diuretics and angiotensin blockers.    Review of Systems  Constitutional:  Negative for chills, fatigue, fever and unexpected weight change.  HENT:  Negative for dental problem, mouth sores and trouble swallowing.   Eyes:  Negative for visual disturbance.  Respiratory:  Negative for cough, choking, chest tightness, shortness of breath and wheezing.   Cardiovascular:  Negative for chest pain, palpitations and leg swelling.  Gastrointestinal:  Negative for abdominal distention, abdominal pain, constipation, diarrhea, nausea and vomiting.  Endocrine: Negative for polydipsia, polyphagia and polyuria.  Genitourinary:  Negative for dysuria, flank pain, hematuria and urgency.  Musculoskeletal:  Negative for back pain, gait problem, myalgias and neck pain.  Skin:  Negative for pallor, rash and wound.  Neurological:  Negative for seizures, syncope, weakness, numbness and headaches.  Psychiatric/Behavioral:  Negative for confusion and dysphoric mood.    Objective:    Vitals with BMI 08/26/2021 07/07/2021 05/29/2020  Height 5' 9.5" 5' 9.5" -  Weight 189 lbs 13 oz 202 lbs 3 oz -  BMI 93.73 42.87 -  Systolic 681 98 157   Diastolic 76 60 76  Pulse 72 72 69    BP 110/76   Pulse 72   Ht 5' 9.5" (1.765 m)   Wt 189 lb 12.8 oz (86.1 kg)   BMI 27.63 kg/m   Wt Readings from Last 3 Encounters:  08/26/21 189 lb 12.8 oz (86.1 kg)  07/07/21 202 lb 3.2 oz (91.7 kg)  04/15/14 219 lb 14.4 oz (99.7 kg)     Physical Exam Constitutional:      General: He is not in acute distress.    Appearance: He is well-developed.  HENT:     Head: Normocephalic and atraumatic.  Neck:     Thyroid: No thyromegaly.     Trachea: No tracheal deviation.  Cardiovascular:     Rate and Rhythm: Normal rate.     Pulses:          Dorsalis pedis pulses are 1+ on the right side and 1+ on the left side.       Posterior tibial pulses are 1+ on the right side and 1+ on the left side.     Heart sounds: Normal heart sounds, S1 normal and S2 normal. No murmur heard.   No gallop.  Pulmonary:     Effort: Pulmonary effort is normal. No respiratory distress.     Breath sounds: Normal breath sounds. No wheezing.  Abdominal:     General: Bowel sounds are normal. There is no distension.     Palpations: Abdomen is soft.     Tenderness: There is no abdominal tenderness. There is no guarding.  Musculoskeletal:     Right shoulder: No swelling or deformity.     Cervical back: Normal range of motion and neck supple.  Skin:    General: Skin is warm and dry.     Findings: No rash.     Nails: There is no clubbing.  Neurological:     Mental Status: He is alert and oriented to person, place, and time.     Cranial Nerves: No cranial nerve deficit.     Sensory: No sensory deficit.     Gait: Gait normal.     Deep Tendon Reflexes: Reflexes are normal and symmetric.  Psychiatric:        Speech: Speech normal.        Behavior: Behavior normal. Behavior is cooperative.        Thought Content: Thought content normal.  Judgment: Judgment normal.      CMP ( most recent) CMP     Component Value Date/Time   NA 146 04/05/2014 0908   K 4.4  04/05/2014 0908   CL 109 04/05/2014 0908   CO2 25 04/05/2014 0908   GLUCOSE 106 (H) 04/05/2014 0908   BUN 21 05/19/2021 0000   CREATININE 1.1 05/19/2021 0000   CREATININE 0.87 04/05/2014 0908   CALCIUM 9.9 04/05/2014 0908   GFRNONAA >90 04/05/2014 0908   GFRAA >90 04/05/2014 0908     Diabetic Labs (most recent): Lab Results  Component Value Date   HGBA1C 8.7 08/21/2021   HGBA1C 10.8 05/19/2021     Lipid Panel ( most recent) Lipid Panel     Component Value Date/Time   CHOL 120 08/21/2021 0000   TRIG 162 (A) 08/21/2021 0000   HDL 37 08/21/2021 0000   LDLCALC 55 08/21/2021 0000        Assessment & Plan:   Uncontrolled type 2 diabetes  - Mario [name] has currently uncontrolled symptomatic type 2 DM since  70 years of age.   Mario [name] presents with his CGM device showing average blood glucose of 166 over the last 7 days.  He has 73% time range, 23% above range, no significant hypoglycemia.  He is recent A1c was 8.7%, improving from 10.8% during his last visit.    Recent labs reviewed. - I had a long discussion with him about the progressive nature of diabetes and the pathology behind its complications. -He does not report any gross complications, however he remains at a high risk for more acute and chronic complications which include CAD, CVA, CKD, retinopathy, and neuropathy. These are all discussed in detail with him.  - I have counseled him on diet  and weight management  by adopting a carbohydrate restricted/protein rich diet. Patient is encouraged to switch to  unprocessed or minimally processed     complex starch and increased protein intake (animal or plant source), fruits, and vegetables. -  he is advised to stick to a routine mealtimes to eat 3 meals  a day and avoid unnecessary snacks ( to snack only to correct hypoglycemia).   - he acknowledges that there is a room for improvement in his food and drink choices. - Suggestion is made for him to avoid simple  carbohydrates  from his diet including Cakes, Sweet Desserts, Ice Cream, Soda (diet and regular), Sweet Tea, Candies, Chips, Cookies, Store Bought Juices, Alcohol in Excess of  1-2 drinks a day, Artificial Sweeteners,  Coffee Creamer, and "Sugar-free" Products, Lemonade. This will help patient to have more stable blood glucose profile and potentially avoid unintended weight gain.   - he will be scheduled with Jearld Fenton, RDN, CDE for diabetes education.  - I have approached him with the following individualized plan to manage  his diabetes and patient agrees:   -Evidently, he is responding to his recent addition of Ozempic with near target fasting glycemic profile.  He presents with significant improvement in his glycemia and significant weight loss, see above. -He will not need insulin treatment for now.   -He is encouraged to continue to use his CGM.  - he is encouraged to call clinic for blood glucose levels less than 70 or above 200 mg /dl at fasting.  -In the meantime, he is advised to continue Ozempic 1 mg subcutaneously weekly, continue metformin 1000 mg p.o. twice daily, and lower Jardiance to 10 mg p.o. once a day at breakfast.    -  Specific targets for  A1c;  LDL, HDL,  and Triglycerides were discussed with the patient.  2) Blood Pressure /Hypertension:   His blood pressure is controlled to target.  he is advised to continue his current medications including hydrochlorothiazide 12.5 mg p.o. daily mg p.o. daily with breakfast .  He is also on Benicar.  Since he would benefit from Benicar, he will be advised to discontinue hydrochlorothiazide if he returns with marginal blood pressure.  3) Lipids/Hyperlipidemia:   Review of his recent lipid panel showed improved triglyceride level to 162 from 269.  And his LDL is 55, improving from 68.    He is advised to continue atorvastatin 40 mg p.o. nightly.  Side effects and precautions discussed with him.    4)  Weight/Diet:  Body mass index  is 27.63 kg/m.  -   Patient is status post losing 113 pounds progressively.   he is still  a candidate for  some more weight loss. I discussed with him the fact that loss of 5 - 10% of his  current body weight will have the most impact on his diabetes management.  Exercise, and detailed carbohydrates information provided  -  detailed on discharge instructions.  5) Chronic Care/Health Maintenance:  -he  is on ACEI/ARB and Statin medications and  is encouraged to initiate and continue to follow up with Ophthalmology, Dentist,  Podiatrist at least yearly or according to recommendations, and advised to   stay away from smoking. I have recommended yearly flu vaccine and pneumonia vaccine at least every 5 years; moderate intensity exercise for up to 150 minutes weekly; and  sleep for at least 7 hours a day.  - he is  advised to maintain close follow up with Mario Medin, MD for primary care needs, as well as his other providers for optimal and coordinated care.    I spent 44 minutes in the care of the patient today including review of labs from [place], Lipids, Thyroid Function, Hematology (current and previous including abstractions from other facilities); face-to-face time discussing  his blood glucose readings/logs, discussing hypoglycemia and hyperglycemia episodes and symptoms, medications doses, his options of short and long term treatment based on the latest standards of care / guidelines;  discussion about incorporating lifestyle medicine;  and documenting the encounter.    Please refer to Patient Instructions for Blood Glucose Monitoring and Insulin/Medications Dosing Guide"  in media tab for additional information. Please  also refer to " Patient Self Inventory" in the Media  tab for reviewed elements of pertinent patient history.  Mario [name] participated in the discussions, expressed understanding, and voiced agreement with the above plans.  All questions were answered to his satisfaction. he is  encouraged to contact clinic should he have any questions or concerns prior to his return visit.    Follow up plan: - Return in about 3 months (around 11/26/2021) for Bring Meter and Logs- A1c in Office.  Glade Lloyd, MD [facility] [address] [place], [place] [address] Phone: [phone]  Fax: [phone]    08/26/2021, 9:01 AM  This note was partially dictated with voice recognition software. Similar sounding words can be transcribed inadequately or may not  be corrected upon review.

## 2021-09-07 ENCOUNTER — Telehealth: Payer: Self-pay | Admitting: "Endocrinology

## 2021-09-07 DIAGNOSIS — E1165 Type 2 diabetes mellitus with hyperglycemia: Secondary | ICD-10-CM

## 2021-09-07 MED ORDER — FREESTYLE LIBRE 2 SENSOR MISC: 2 refills | Status: DC

## 2021-09-07 NOTE — Telephone Encounter (Signed)
Pt requesting refill on his [employer], please send to [facility] on [address]

## 2021-09-07 NOTE — Telephone Encounter (Signed)
Rx sent 

## 2021-09-08 ENCOUNTER — Other Ambulatory Visit: Payer: Self-pay | Admitting: "Endocrinology

## 2021-10-05 ENCOUNTER — Telehealth: Payer: Self-pay | Admitting: "Endocrinology

## 2021-10-05 NOTE — Telephone Encounter (Signed)
Patient states he needs a refill on his [employer]. Please send to [facility] on [address].

## 2021-10-05 NOTE — Telephone Encounter (Signed)
Called patient to advise him to contact his pharmacy as he has refills on file. Patient verbalized an understanding.

## 2021-10-06 DIAGNOSIS — Z6827 Body mass index (BMI) 27.0-27.9, adult: Secondary | ICD-10-CM | POA: Diagnosis not present

## 2021-10-06 DIAGNOSIS — M5416 Radiculopathy, lumbar region: Secondary | ICD-10-CM | POA: Diagnosis not present

## 2021-10-23 DIAGNOSIS — I10 Essential (primary) hypertension: Secondary | ICD-10-CM | POA: Diagnosis not present

## 2021-10-29 DIAGNOSIS — M25551 Pain in right hip: Secondary | ICD-10-CM | POA: Diagnosis not present

## 2021-10-29 DIAGNOSIS — R2689 Other abnormalities of gait and mobility: Secondary | ICD-10-CM | POA: Diagnosis not present

## 2021-10-29 DIAGNOSIS — R531 Weakness: Secondary | ICD-10-CM | POA: Diagnosis not present

## 2021-10-29 DIAGNOSIS — M25562 Pain in left knee: Secondary | ICD-10-CM | POA: Diagnosis not present

## 2021-10-30 DIAGNOSIS — R2689 Other abnormalities of gait and mobility: Secondary | ICD-10-CM | POA: Diagnosis not present

## 2021-10-30 DIAGNOSIS — R531 Weakness: Secondary | ICD-10-CM | POA: Diagnosis not present

## 2021-10-30 DIAGNOSIS — M25562 Pain in left knee: Secondary | ICD-10-CM | POA: Diagnosis not present

## 2021-10-30 DIAGNOSIS — M25551 Pain in right hip: Secondary | ICD-10-CM | POA: Diagnosis not present

## 2021-11-03 DIAGNOSIS — R2689 Other abnormalities of gait and mobility: Secondary | ICD-10-CM | POA: Diagnosis not present

## 2021-11-03 DIAGNOSIS — R531 Weakness: Secondary | ICD-10-CM | POA: Diagnosis not present

## 2021-11-03 DIAGNOSIS — M25562 Pain in left knee: Secondary | ICD-10-CM | POA: Diagnosis not present

## 2021-11-03 DIAGNOSIS — M25551 Pain in right hip: Secondary | ICD-10-CM | POA: Diagnosis not present

## 2021-11-10 DIAGNOSIS — R2689 Other abnormalities of gait and mobility: Secondary | ICD-10-CM | POA: Diagnosis not present

## 2021-11-10 DIAGNOSIS — R531 Weakness: Secondary | ICD-10-CM | POA: Diagnosis not present

## 2021-11-10 DIAGNOSIS — M25551 Pain in right hip: Secondary | ICD-10-CM | POA: Diagnosis not present

## 2021-11-10 DIAGNOSIS — M25562 Pain in left knee: Secondary | ICD-10-CM | POA: Diagnosis not present

## 2021-11-26 ENCOUNTER — Other Ambulatory Visit: Payer: Self-pay

## 2021-11-26 ENCOUNTER — Encounter: Payer: Self-pay | Admitting: "Endocrinology

## 2021-11-26 ENCOUNTER — Ambulatory Visit: Payer: HMO | Admitting: "Endocrinology

## 2021-11-26 VITALS — BP 120/82 | HR 72 | Ht 69.5 in | Wt 186.8 lb

## 2021-11-26 DIAGNOSIS — E782 Mixed hyperlipidemia: Secondary | ICD-10-CM

## 2021-11-26 DIAGNOSIS — E1165 Type 2 diabetes mellitus with hyperglycemia: Secondary | ICD-10-CM | POA: Diagnosis not present

## 2021-11-26 DIAGNOSIS — I10 Essential (primary) hypertension: Secondary | ICD-10-CM

## 2021-11-26 LAB — POCT GLYCOSYLATED HEMOGLOBIN (HGB A1C): HbA1c, POC (controlled diabetic range): 7.3 % — AB (ref 0.0–7.0)

## 2021-11-26 MED ORDER — FREESTYLE LIBRE 2 SENSOR MISC: 2 refills | Status: DC

## 2021-11-26 NOTE — Progress Notes (Signed)
11/26/2021, 3:11 PM  Endocrinology follow-up note   Subjective:    Patient ID: Mario [name], male    DOB: April 03, 1951.  Mario [name] is being seen in follow-up after he was seen in consultation for  management of currently uncontrolled symptomatic diabetes requested by  Rodena Medin, MD.   Past Medical History:  Diagnosis Date   Chronic back pain    radiculopatly  and stenosis   Complication of anesthesia    woke up being very agitated   Diabetes mellitus without complication ([place])    takes Metformin and Amaryl daily   History of gout 25yrs ago   History of kidney stones 30+yrs ago   History of shingles 1990   Hyperlipidemia    takes Atorvastatin daily   Rosacea    uses Metronidazole daily    Past Surgical History:  Procedure Laterality Date   CERVICAL FUSION  1998   COLONOSCOPY     LUMBAR LAMINECTOMY/DECOMPRESSION MICRODISCECTOMY N/A 04/15/2014   Procedure: LUMBAR LAMINECTOMY/DECOMPRESSION MICRODISCECTOMY 2 LEVELS  lumbar three/four, four/five  ;  Surgeon: Ophelia Charter, MD;  Location: [facility];  Service: Neurosurgery;  Laterality: N/A;   TONSILLECTOMY  1958    Social History   Socioeconomic History   Marital status: Married    Spouse name: Not on file   Number of children: Not on file   Years of education: Not on file   Highest education level: Not on file  Occupational History   Not on file  Tobacco Use   Smoking status: Never   Smokeless tobacco: Not on file  Substance and Sexual Activity   Alcohol use: No   Drug use: No   Sexual activity: Yes  Other Topics Concern   Not on file  Social History Narrative   Not on file   Social Determinants of Health   Financial Resource Strain: Not on file  Food Insecurity: Not on file  Transportation Needs: Not on file  Physical Activity: Not on file  Stress: Not on file  Social Connections: Not on file    Family History   Problem Relation Age of Onset   Hypertension Mother    Diabetes Mother    Hyperlipidemia Mother    Heart attack Mother    Heart failure Mother    Cancer Father    Heart attack Father    Hyperlipidemia Father    Hypertension Father     Outpatient Encounter Medications as of 11/26/2021  Medication Sig   aspirin EC 325 MG tablet Take 325 mg by mouth daily.   atorvastatin (LIPITOR) 40 MG tablet Take 40 mg by mouth at bedtime.   Continuous Blood Gluc Receiver (FREESTYLE LIBRE 2 READER) DEVI 1 each by Does not apply route as directed. Use to check glucose as directed   Continuous Blood Gluc Sensor (FREESTYLE LIBRE 2 SENSOR) MISC Change sensor every 14 days.   empagliflozin (JARDIANCE) 10 MG TABS tablet Take 1 tablet (10 mg total) by mouth daily before breakfast.   hydrochlorothiazide (MICROZIDE) 12.5 MG capsule Take 12.5 mg by mouth every morning.   Ibuprofen-Diphenhydramine Cit (ADVIL PM PO) Take 2 tablets  by mouth at bedtime.   metFORMIN (GLUCOPHAGE) 1000 MG tablet Take 1,000 mg by mouth 2 (two) times daily with a meal.   metroNIDAZOLE (METROCREAM) 0.75 % cream Apply 1 application topically 2 (two) times daily. To face   olmesartan (BENICAR) 20 MG tablet Take 40 mg by mouth daily.   Omega-3 Fatty Acids (FISH OIL) 1200 MG CAPS Take 2,400 mg by mouth daily.   OZEMPIC, 1 MG/DOSE, 4 MG/3ML SOPN INJECT ONE MG into THE SKIN ONCE A WEEK   [DISCONTINUED] Continuous Blood Gluc Sensor (FREESTYLE LIBRE 2 SENSOR) MISC Change sensor every 14 days.   No facility-administered encounter medications on file as of 11/26/2021.    ALLERGIES: Allergies  Allergen Reactions   Adhesive [Tape]     Breaks out    VACCINATION STATUS:  There is no immunization history on file for this patient.  Diabetes He presents for his follow-up diabetic visit. He has type 2 diabetes mellitus. Onset time: He was diagnosed at approximate age of 54 years. His disease course has been improving. There are no hypoglycemic  associated symptoms. Pertinent negatives for hypoglycemia include no confusion, headaches, pallor or seizures. Pertinent negatives for diabetes include no chest pain, no fatigue, no polydipsia, no polyphagia, no polyuria and no weakness. There are no hypoglycemic complications. Symptoms are improving. There are no diabetic complications. Risk factors for coronary artery disease include diabetes mellitus, dyslipidemia, hypertension, male sex and family history. Current diabetic treatments: He is currently on Ozempic 0.5 mg weekly, Jardiance 25 mg p.o. daily, and metformin 1000 mg p.o. twice daily. His weight is decreasing steadily (Patient gives history of weighing as much as 300 pounds, progressively lost close to 100 pounds.  He continues to lose 30 pounds since his last visit.). When asked about meal planning, he reported none. His home blood glucose trend is decreasing steadily. His breakfast blood glucose range is generally 140-180 mg/dl. His overall blood glucose range is 140-180 mg/dl. Mario [name] presents with his CGM device showing average blood glucose of 166 over the last 7 days.  He has 72% time range, 28% above range, no significant hypoglycemia.  His point-of-care A1c is 7.3%, overall improving from 10.8%.  ) An ACE inhibitor/angiotensin II receptor blocker is being taken. Eye exam is current.  Hyperlipidemia This is a chronic problem. The problem is uncontrolled. Exacerbating diseases include diabetes. Pertinent negatives include no chest pain, myalgias or shortness of breath. Current antihyperlipidemic treatment includes statins. Risk factors for coronary artery disease include diabetes mellitus, dyslipidemia, hypertension, male sex and a sedentary lifestyle.  Hypertension This is a chronic problem. The problem is controlled. Pertinent negatives include no chest pain, headaches, neck pain, palpitations or shortness of breath. Risk factors for coronary artery disease include dyslipidemia, diabetes  mellitus, family history, male gender and sedentary lifestyle. Past treatments include diuretics and angiotensin blockers.    Review of Systems  Constitutional:  Negative for chills, fatigue, fever and unexpected weight change.  HENT:  Negative for dental problem, mouth sores and trouble swallowing.   Eyes:  Negative for visual disturbance.  Respiratory:  Negative for cough, choking, chest tightness, shortness of breath and wheezing.   Cardiovascular:  Negative for chest pain, palpitations and leg swelling.  Gastrointestinal:  Negative for abdominal distention, abdominal pain, constipation, diarrhea, nausea and vomiting.  Endocrine: Negative for polydipsia, polyphagia and polyuria.  Genitourinary:  Negative for dysuria, flank pain, hematuria and urgency.  Musculoskeletal:  Negative for back pain, gait problem, myalgias and neck pain.  Skin:  Negative for pallor, rash and wound.  Neurological:  Negative for seizures, syncope, weakness, numbness and headaches.  Psychiatric/Behavioral:  Negative for confusion and dysphoric mood.    Objective:    Vitals with BMI 11/26/2021 08/26/2021 07/07/2021  Height 5' 9.5" 5' 9.5" 5' 9.5"  Weight 186 lbs 13 oz 189 lbs 13 oz 202 lbs 3 oz  BMI 27.2 09.32 67.12  Systolic 458 099 98  Diastolic 82 76 60  Pulse 72 72 72    BP 120/82    Pulse 72    Ht 5' 9.5" (1.765 m)    Wt 186 lb 12.8 oz (84.7 kg)    BMI 27.19 kg/m   Wt Readings from Last 3 Encounters:  11/26/21 186 lb 12.8 oz (84.7 kg)  08/26/21 189 lb 12.8 oz (86.1 kg)  07/07/21 202 lb 3.2 oz (91.7 kg)      CMP ( most recent) CMP     Component Value Date/Time   NA 146 04/05/2014 0908   K 4.4 04/05/2014 0908   CL 109 04/05/2014 0908   CO2 25 04/05/2014 0908   GLUCOSE 106 (H) 04/05/2014 0908   BUN 21 05/19/2021 0000   CREATININE 1.1 05/19/2021 0000   CREATININE 0.87 04/05/2014 0908   CALCIUM 9.9 04/05/2014 0908   GFRNONAA >90 04/05/2014 0908   GFRAA >90 04/05/2014 0908     Diabetic Labs  (most recent): Lab Results  Component Value Date   HGBA1C 7.3 (A) 11/26/2021   HGBA1C 8.7 08/21/2021   HGBA1C 10.8 05/19/2021     Lipid Panel ( most recent) Lipid Panel     Component Value Date/Time   CHOL 120 08/21/2021 0000   TRIG 162 (A) 08/21/2021 0000   HDL 37 08/21/2021 0000   LDLCALC 55 08/21/2021 0000        Assessment & Plan:   Uncontrolled type 2 diabetes  - Mario [name] has currently uncontrolled symptomatic type 2 DM since  71 years of age.   [place] presents with his CGM device showing average blood glucose of 166 over the last 7 days.  He has 72% time range, 28% above range, no significant hypoglycemia.  His point-of-care A1c is 7.3%, overall improving from 10.8%.   Recent labs reviewed. - I had a long discussion with him about the progressive nature of diabetes and the pathology behind its complications. -He does not report any gross complications, however he remains at a high risk for more acute and chronic complications which include CAD, CVA, CKD, retinopathy, and neuropathy. These are all discussed in detail with him.  - I have counseled him on diet  and weight management  by adopting a carbohydrate restricted/protein rich diet. Patient is encouraged to switch to  unprocessed or minimally processed     complex starch and increased protein intake (animal or plant source), fruits, and vegetables. -  he is advised to stick to a routine mealtimes to eat 3 meals  a day and avoid unnecessary snacks ( to snack only to correct hypoglycemia).  - he acknowledges that there is a room for improvement in his food and drink choices. - Suggestion is made for him to avoid simple carbohydrates  from his diet including Cakes, Sweet Desserts, Ice Cream, Soda (diet and regular), Sweet Tea, Candies, Chips, Cookies, Store Bought Juices, Alcohol , Artificial Sweeteners,  Coffee Creamer, and "Sugar-free" Products, Lemonade. This will help patient to have more stable blood glucose profile  and potentially avoid unintended weight gain.  The following Lifestyle  Medicine recommendations according to [facility]) were discussed and and offered to patient and he  agrees to start the journey:  A. Whole Foods, Plant-Based Nutrition comprising of fruits and vegetables, plant-based proteins, whole-grain carbohydrates was discussed in detail with the patient.   A list for source of those nutrients were also provided to the patient.  Patient will use only water or unsweetened tea for hydration. B.  The need to stay away from risky substances including alcohol, smoking; obtaining 7 to 9 hours of restorative sleep, at least 150 minutes of moderate intensity exercise weekly, the importance of healthy social connections,  and stress management techniques were discussed.   - he will be scheduled with Jearld Fenton, RDN, CDE for diabetes education.  - I have approached him with the following individualized plan to manage  his diabetes and patient agrees:   -He continues to respond to his current set of medications including Ozempic.  He is advised to continue metformin 1000 mg p.o. twice daily, Jardiance 10 mg p.o. daily at breakfast.  Side effects and precautions discussed with him.  He is tolerating and advised to continue Ozempic 1 mg subcutaneously weekly.    -He is encouraged to continue to use his CGM.  - he is encouraged to call clinic for blood glucose levels less than 70 or above 200 mg /dl at fasting. - Specific targets for  A1c;  LDL, HDL,  and Triglycerides were discussed with the patient.  2) Blood Pressure /Hypertension:   -His blood pressure is controlled today.  he is advised to continue his current medications including hydrochlorothiazide 12.5 mg p.o. daily mg p.o. daily with breakfast .  He is also on Benicar.  Since he would benefit from Benicar, he will be advised to discontinue hydrochlorothiazide if he returns with marginal blood  pressure.  3) Lipids/Hyperlipidemia:   Review of his recent lipid panel showed improved triglyceride level to 162 from 269.  And his LDL is 55, improving from 68.    He is advised to continue atorvastatin 40 mg p.o. nightly.   Side effects and precautions discussed with him.    4)  Weight/Diet:  Body mass index is 27.19 kg/m.  -   Patient is status post losing 113 pounds progressively.   he is still  a candidate for  some more weight loss. I discussed with him the fact that loss of 5 - 10% of his  current body weight will have the most impact on his diabetes management.  Exercise, and detailed carbohydrates information provided  -  detailed on discharge instructions.  5) Chronic Care/Health Maintenance:  -he  is on ACEI/ARB and Statin medications and  is encouraged to initiate and continue to follow up with Ophthalmology, Dentist,  Podiatrist at least yearly or according to recommendations, and advised to   stay away from smoking. I have recommended yearly flu vaccine and pneumonia vaccine at least every 5 years; moderate intensity exercise for up to 150 minutes weekly; and  sleep for at least 7 hours a day.  - he is  advised to maintain close follow up with Rodena Medin, MD for primary care needs, as well as his other providers for optimal and coordinated care.  I spent 45 minutes in the care of the patient today including review of labs from [place], Lipids, Thyroid Function, Hematology (current and previous including abstractions from other facilities); face-to-face time discussing  his blood glucose readings/logs, discussing hypoglycemia and hyperglycemia  episodes and symptoms, medications doses, his options of short and long term treatment based on the latest standards of care / guidelines;  discussion about incorporating lifestyle medicine;  and documenting the encounter.    Please refer to Patient Instructions for Blood Glucose Monitoring and Insulin/Medications Dosing Guide"  in media tab for  additional information. Please  also refer to " Patient Self Inventory" in the Media  tab for reviewed elements of pertinent patient history.  Mario [name] participated in the discussions, expressed understanding, and voiced agreement with the above plans.  All questions were answered to his satisfaction. he is encouraged to contact clinic should he have any questions or concerns prior to his return visit.   Follow up plan: - Return in about 4 months (around 03/26/2022) for F/U with Pre-visit Labs, Meter, Logs, A1c here.Glade Lloyd, MD [facility] [address] [place], [place] [address] Phone: [phone]  Fax: [phone]    11/26/2021, 3:11 PM  This note was partially dictated with voice recognition software. Similar sounding words can be transcribed inadequately or may not  be corrected upon review.

## 2021-11-26 NOTE — Patient Instructions (Signed)

## 2021-11-29 ENCOUNTER — Other Ambulatory Visit: Payer: Self-pay | Admitting: "Endocrinology

## 2021-11-29 DIAGNOSIS — E1165 Type 2 diabetes mellitus with hyperglycemia: Secondary | ICD-10-CM

## 2021-12-01 DIAGNOSIS — K649 Unspecified hemorrhoids: Secondary | ICD-10-CM | POA: Diagnosis not present

## 2021-12-01 DIAGNOSIS — Z86010 Personal history of colonic polyps: Secondary | ICD-10-CM | POA: Diagnosis not present

## 2021-12-01 DIAGNOSIS — D123 Benign neoplasm of transverse colon: Secondary | ICD-10-CM | POA: Diagnosis not present

## 2021-12-01 DIAGNOSIS — K5730 Diverticulosis of large intestine without perforation or abscess without bleeding: Secondary | ICD-10-CM | POA: Diagnosis not present

## 2021-12-08 DIAGNOSIS — D123 Benign neoplasm of transverse colon: Secondary | ICD-10-CM | POA: Diagnosis not present

## 2021-12-22 DIAGNOSIS — I10 Essential (primary) hypertension: Secondary | ICD-10-CM | POA: Diagnosis not present

## 2021-12-25 ENCOUNTER — Ambulatory Visit (INDEPENDENT_AMBULATORY_CARE_PROVIDER_SITE_OTHER): Payer: HMO | Admitting: Orthopedic Surgery

## 2021-12-25 ENCOUNTER — Encounter: Payer: Self-pay | Admitting: Orthopedic Surgery

## 2021-12-25 ENCOUNTER — Ambulatory Visit: Payer: HMO

## 2021-12-25 ENCOUNTER — Other Ambulatory Visit: Payer: Self-pay

## 2021-12-25 VITALS — BP 113/87 | HR 95 | Ht 70.0 in | Wt 183.0 lb

## 2021-12-25 DIAGNOSIS — M25562 Pain in left knee: Secondary | ICD-10-CM | POA: Diagnosis not present

## 2021-12-25 NOTE — Patient Instructions (Addendum)
Instructions Following Joint Injections  In clinic today, you received an injection in one of your joints (sometimes more than one).  Occasionally, you can have some pain at the injection site, this is normal.  You can place ice at the injection site, or take over-the-counter medications such as Tylenol (acetaminophen) or Advil (ibuprofen).  Please follow all directions listed on the bottle.  If your joint (knee or shoulder) becomes swollen, red or very painful, please contact the clinic for additional assistance.   Two medications were injected, including lidocaine and a steroid (often referred to as cortisone).  Lidocaine is effective almost immediately but wears off quickly.  However, the steroid can take a few days to improve your symptoms.  In some cases, it can make your pain worse for a couple of days.  Do not be concerned if this happens as it is common.  You can apply ice or take some over-the-counter medications as needed.      Knee Exercises  Ask your health care provider which exercises are safe for you. Do exercises exactly as told by your health care provider and adjust them as directed. It is normal to feel mild stretching, pulling, tightness, or discomfort as you do these exercises. Stop right away if you feel sudden pain or your pain gets worse. Do not begin these exercises until told by your health care provider.  Stretching and range-of-motion exercises These exercises warm up your muscles and joints and improve the movement and flexibility of your knee. These exercises also help to relieve pain and swelling.  Knee extension, prone Lie on your abdomen (prone position) on a bed. Place your left / right knee just beyond the edge of the surface so your knee is not on the bed. You can put a towel under your left / right thigh just above your kneecap for comfort. Relax your leg muscles and allow gravity to straighten your knee (extension). You should feel a stretch behind your left  / right knee. Hold this position for 10 seconds. Scoot up so your knee is supported between repetitions. Repeat 10 times. Complete this exercise 3-4 times per week.     Knee flexion, active Lie on your back with both legs straight. If this causes back discomfort, bend your left / right knee so your foot is flat on the floor. Slowly slide your left / right heel back toward your buttocks. Stop when you feel a gentle stretch in the front of your knee or thigh (flexion). Hold this position for 10 seconds. Slowly slide your left / right heel back to the starting position. Repeat 10 times. Complete this exercise 3-4 times per week.      Quadriceps stretch, prone Lie on your abdomen on a firm surface, such as a bed or padded floor. Bend your left / right knee and hold your ankle. If you cannot reach your ankle or pant leg, loop a belt around your foot and grab the belt instead. Gently pull your heel toward your buttocks. Your knee should not slide out to the side. You should feel a stretch in the front of your thigh and knee (quadriceps). Hold this position for 10 seconds. Repeat 10 times. Complete this exercise 3-4 times per week.      Hamstring, supine Lie on your back (supine position). Loop a belt or towel over the ball of your left / right foot. The ball of your foot is on the walking surface, right under your toes. Straighten your left /   right knee and slowly pull on the belt to raise your leg until you feel a gentle stretch behind your knee (hamstring). Do not let your knee bend while you do this. Keep your other leg flat on the floor. Hold this position for 10 seconds. Repeat 10 times. Complete this exercise 3-4 times per week.   Strengthening exercises These exercises build strength and endurance in your knee. Endurance is the ability to use your muscles for a long time, even after they get tired.  Quadriceps, isometric This exercise stretches the muscles in front of your thigh  (quadriceps) without moving your knee joint (isometric). Lie on your back with your left / right leg extended and your other knee bent. Put a rolled towel or small pillow under your knee if told by your health care provider. Slowly tense the muscles in the front of your left / right thigh. You should see your kneecap slide up toward your hip or see increased dimpling just above the knee. This motion will push the back of the knee toward the floor. For 10 seconds, hold the muscle as tight as you can without increasing your pain. Relax the muscles slowly and completely. Repeat 10 times. Complete this exercise 3-4 times per week. .     Straight leg raises This exercise stretches the muscles in front of your thigh (quadriceps) and the muscles that move your hips (hip flexors). Lie on your back with your left / right leg extended and your other knee bent. Tense the muscles in the front of your left / right thigh. You should see your kneecap slide up or see increased dimpling just above the knee. Your thigh may even shake a bit. Keep these muscles tight as you raise your leg 4-6 inches (10-15 cm) off the floor. Do not let your knee bend. Hold this position for 10 seconds. Keep these muscles tense as you lower your leg. Relax your muscles slowly and completely after each repetition. Repeat 10 times. Complete this exercise 3-4 times per week.  Hamstring, isometric Lie on your back on a firm surface. Bend your left / right knee about 30 degrees. Dig your left / right heel into the surface as if you are trying to pull it toward your buttocks. Tighten the muscles in the back of your thighs (hamstring) to "dig" as hard as you can without increasing any pain. Hold this position for 10 seconds. Release the tension gradually and allow your muscles to relax completely for __________ seconds after each repetition. Repeat 10 times. Complete this exercise 3-4 times per week.  Hamstring curls If told by your  health care provider, do this exercise while wearing ankle weights. Begin with 5 lb weights. Then increase the weight by 1 lb (0.5 kg) increments. You can also use an exercise band Lie on your abdomen with your legs straight. Bend your left / right knee as far as you can without feeling pain. Keep your hips flat against the floor. Hold this position for 10 seconds. Slowly lower your leg to the starting position. Repeat 10 times. Complete this exercise 3-4 times per week.      Squats This exercise strengthens the muscles in front of your thigh and knee (quadriceps). Stand in front of a table, with your feet and knees pointing straight ahead. You may rest your hands on the table for balance but not for support. Slowly bend your knees and lower your hips like you are going to sit in a chair. Keep   your weight over your heels, not over your toes. Keep your lower legs upright so they are parallel with the table legs. Do not let your hips go lower than your knees. Do not bend lower than told by your health care provider. If your knee pain increases, do not bend as low. Hold the squat position for 10 seconds. Slowly push with your legs to return to standing. Do not use your hands to pull yourself to standing. Repeat 10 times. Complete this exercise 3-4 times per week .     Wall slides This exercise strengthens the muscles in front of your thigh and knee (quadriceps). Lean your back against a smooth wall or door, and walk your feet out 18-24 inches (46-61 cm) from it. Place your feet hip-width apart. Slowly slide down the wall or door until your knees bend 90 degrees. Keep your knees over your heels, not over your toes. Keep your knees in line with your hips. Hold this position for 10 seconds. Repeat 10 times. Complete this exercise 3-4 times per week.      Straight leg raises This exercise strengthens the muscles that rotate the leg at the hip and move it away from your body (hip  abductors). Lie on your side with your left / right leg in the top position. Lie so your head, shoulder, knee, and hip line up. You may bend your bottom knee to help you keep your balance. Roll your hips slightly forward so your hips are stacked directly over each other and your left / right knee is facing forward. Leading with your heel, lift your top leg 4-6 inches (10-15 cm). You should feel the muscles in your outer hip lifting. Do not let your foot drift forward. Do not let your knee roll toward the ceiling. Hold this position for 10 seconds. Slowly return your leg to the starting position. Let your muscles relax completely after each repetition. Repeat 10 times. Complete this exercise 3-4 times per week.      Straight leg raises This exercise stretches the muscles that move your hips away from the front of the pelvis (hip extensors). Lie on your abdomen on a firm surface. You can put a pillow under your hips if that is more comfortable. Tense the muscles in your buttocks and lift your left / right leg about 4-6 inches (10-15 cm). Keep your knee straight as you lift your leg. Hold this position for 10 seconds. Slowly lower your leg to the starting position. Let your leg relax completely after each repetition. Repeat 10 times. Complete this exercise 3-4 times per week.  

## 2021-12-25 NOTE — Progress Notes (Addendum)
New Patient Visit ? ?Assessment: ?Mario [name] is a 71 y.o. male with the following: ?1. Acute pain of left knee ? ?Plan: ?Patient has had pain for the past week.  Does describe a twisting event, which was not followed by a significant effusion of the left knee.  Nonetheless, his pain has been bothering him ever since.  No prior injuries to his left knee.  It is possible that he has sustained a bone bruise type injury to the left knee, but this is not identified on an x-ray.  In addition, due to the lack of effusion since the onset of pain, I do not think he has sustained an injury to his meniscus.  Discussed multiple options in clinic today, and he would like to proceed with an injection.  This was completed in clinic today.  He will follow-up as needed. ? ? ?Procedure note injection Left knee joint ?  ?Verbal consent was obtained to inject the left knee joint  ?Timeout was completed to confirm the site of injection.  The skin was prepped with alcohol and ethyl chloride was sprayed at the injection site.  ?A 21-gauge needle was used to inject 40 mg of Depo-Medrol and 1% lidocaine (3 cc) into the left knee using an anterolateral approach.  ?There were no complications. A sterile bandage was applied. ?  ? ? ? ?Follow-up: ?Return if symptoms worsen or fail to improve. ? ?Subjective: ? ?Chief Complaint  ?Patient presents with  ? New Patient (Initial Visit)  ? Knee Pain  ?  LT knee// painful x 1 wk. ?Pt was putting pants on and was twisting and has been painful ever since  ? ? ?History of Present Illness: ?Mario [name] is a 71 y.o. male who presents to clinic today for evaluation of his left knee.  He has had pain in the left knee for the past week.  He states he was trying to put his pants on, when he twisted his knee.  Since then, he has had pain in the left knee.  Pain is primarily within the medial aspect of his knee.  He denies any swelling in his knee.  He has been taking naproxen, with some improvement in his  symptoms.  No prior injury to this knee.  He has not worked with physical therapy.  He is never had an injection in this knee. ? ? ?Review of Systems: ?No fevers or chills ?No numbness or tingling ?No chest pain ?No shortness of breath ?No bowel or bladder dysfunction ?No GI distress ?No headaches ? ? ?Medical History: ? ?Past Medical History:  ?Diagnosis Date  ? Chronic back pain   ? radiculopatly  and stenosis  ? Complication of anesthesia   ? woke up being very agitated  ? Diabetes mellitus without complication ([place])   ? takes Metformin and Amaryl daily  ? History of gout 69yrs ago  ? History of kidney stones 30+yrs ago  ? History of shingles 1990  ? Hyperlipidemia   ? takes Atorvastatin daily  ? Rosacea   ? uses Metronidazole daily  ? ? ?Past Surgical History:  ?Procedure Laterality Date  ? CERVICAL FUSION  1998  ? COLONOSCOPY    ? LUMBAR LAMINECTOMY/DECOMPRESSION MICRODISCECTOMY N/A 04/15/2014  ? Procedure: LUMBAR LAMINECTOMY/DECOMPRESSION MICRODISCECTOMY 2 LEVELS  lumbar three/four, four/five  ;  Surgeon: Ophelia Charter, MD;  Location: [facility];  Service: Neurosurgery;  Laterality: N/A;  ? TONSILLECTOMY  1958  ? ? ?Family History  ?Problem Relation  Age of Onset  ? Hypertension Mother   ? Diabetes Mother   ? Hyperlipidemia Mother   ? Heart attack Mother   ? Heart failure Mother   ? Cancer Father   ? Heart attack Father   ? Hyperlipidemia Father   ? Hypertension Father   ? ?Social History  ? ?Tobacco Use  ? Smoking status: Never  ?Substance Use Topics  ? Alcohol use: No  ? Drug use: No  ? ? ?Allergies  ?Allergen Reactions  ? Adhesive [Tape]   ?  Breaks out  ? ? ?No outpatient medications have been marked as taking for the 12/25/21 encounter (Office Visit) with [name], MD.  ? ? ?Objective: ?BP 113/87   Pulse 95   Ht 5\' 10"  (1.778 m)   Wt 183 lb (83 kg)   BMI 26.26 kg/m?  ? ?Physical Exam: ? ?General: Alert and oriented. and No acute distress. ?Gait: Left sided antalgic gait. ? ?Evaluation of the  left knee demonstrates no effusion.  No bruising is appreciated.  He has some mild tenderness to palpation over the medial joint line.  Positive crepitus with range of motion testing.  No increased laxity to varus or valgus stress.  Negative Lachman.  No pain in the posterior aspect of his knee. ? ?IMAGING: ?I personally ordered and reviewed the following images ? ?X-rays of the left knee were obtained in clinic today.  No evidence of an acute injury.  He has mild loss of joint space within the medial compartment.  Small osteophytes are noted throughout the knee.  Neutral overall alignment. ? ?Impression: Left knee x-rays with mild degenerative changes. ? ?New Medications:  ?No orders of the defined types were placed in this encounter. ? ? ? ? ?[name], MD ? ?12/25/2021 ?9:09 PM ? ? ?

## 2022-01-10 ENCOUNTER — Ambulatory Visit
Admission: EM | Admit: 2022-01-10 | Discharge: 2022-01-10 | Disposition: A | Discharge status: Home / Self Care | Payer: HMO | Attending: Urgent Care | Admitting: Urgent Care

## 2022-01-10 ENCOUNTER — Other Ambulatory Visit: Payer: Self-pay

## 2022-01-10 ENCOUNTER — Encounter: Payer: Self-pay | Admitting: Emergency Medicine

## 2022-01-10 DIAGNOSIS — E119 Type 2 diabetes mellitus without complications: Secondary | ICD-10-CM | POA: Diagnosis not present

## 2022-01-10 DIAGNOSIS — M5416 Radiculopathy, lumbar region: Secondary | ICD-10-CM | POA: Diagnosis not present

## 2022-01-10 DIAGNOSIS — M5442 Lumbago with sciatica, left side: Secondary | ICD-10-CM | POA: Diagnosis not present

## 2022-01-10 MED ORDER — PREDNISONE 50 MG PO TABS: 50.0000 mg | ORAL_TABLET | Freq: Every day | ORAL | 0 refills | Status: DC

## 2022-01-10 NOTE — ED Triage Notes (Signed)
Pain from left knee that moves up to left hip x a few weeks.  States pain is progressively getting worse.  Previous injury to that knee. ?

## 2022-01-10 NOTE — ED Provider Notes (Signed)
?[facility] ? ? ?MRN: [id number] DOB: [birth year] ? ?Subjective:  ? ?Mario [name] is a 71 y.o. male presenting for several week history of persistent intermittent left lateral low back/buttock/hip pain that radiates into the thigh posteriorly and laterally.  He was just seen by his orthopedist about 2 weeks ago and given a steroid injection to the knee which has helped.  He does have a history of back issues as well.  MRI was done and results are as below.  No recent falls, trauma, weakness, numbness or tingling, saddle paresthesias.  No changes to bowel or urinary habits.  Patient is a type II diabetic, treated without insulin. ? ?No current facility-administered medications for this encounter. ? ?Current Outpatient Medications:  ?  aspirin EC 325 MG tablet, Take 325 mg by mouth daily., Disp: , Rfl:  ?  atorvastatin (LIPITOR) 40 MG tablet, Take 40 mg by mouth at bedtime., Disp: , Rfl:  ?  Continuous Blood Gluc Receiver (FREESTYLE LIBRE 2 READER) DEVI, 1 each by Does not apply route as directed. Use to check glucose as directed, Disp: 1 each, Rfl: 0 ?  Continuous Blood Gluc Sensor (FREESTYLE LIBRE 2 SENSOR) MISC, Change sensor every 14 days., Disp: 2 each, Rfl: 2 ?  empagliflozin (JARDIANCE) 10 MG TABS tablet, Take 1 tablet (10 mg total) by mouth daily before breakfast., Disp: 90 tablet, Rfl: 1 ?  hydrochlorothiazide (MICROZIDE) 12.5 MG capsule, Take 12.5 mg by mouth every morning., Disp: , Rfl:  ?  Ibuprofen-Diphenhydramine Cit (ADVIL PM PO), Take 2 tablets by mouth at bedtime., Disp: , Rfl:  ?  metFORMIN (GLUCOPHAGE) 1000 MG tablet, Take 1,000 mg by mouth 2 (two) times daily with a meal., Disp: , Rfl:  ?  metroNIDAZOLE (METROCREAM) 0.75 % cream, Apply 1 application topically 2 (two) times daily. To face, Disp: , Rfl:  ?  olmesartan (BENICAR) 20 MG tablet, Take 40 mg by mouth daily., Disp: , Rfl:  ?  Omega-3 Fatty Acids (FISH OIL) 1200 MG CAPS, Take 2,400 mg by mouth daily., Disp: , Rfl:  ?   OZEMPIC, 1 MG/DOSE, 4 MG/3ML SOPN, INJECT ONE MG into THE SKIN ONCE A WEEK, Disp: 6 mL, Rfl: 1  ? ?Allergies  ?Allergen Reactions  ? Adhesive [Tape]   ?  Breaks out  ? ? ?Past Medical History:  ?Diagnosis Date  ? Chronic back pain   ? radiculopatly  and stenosis  ? Complication of anesthesia   ? woke up being very agitated  ? Diabetes mellitus without complication ([place])   ? takes Metformin and Amaryl daily  ? History of gout 69yr ago  ? History of kidney stones 30+yrs ago  ? History of shingles 1990  ? Hyperlipidemia   ? takes Atorvastatin daily  ? Rosacea   ? uses Metronidazole daily  ?  ? ?Past Surgical History:  ?Procedure Laterality Date  ? CERVICAL FUSION  1998  ? COLONOSCOPY    ? LUMBAR LAMINECTOMY/DECOMPRESSION MICRODISCECTOMY N/A 04/15/2014  ? Procedure: LUMBAR LAMINECTOMY/DECOMPRESSION MICRODISCECTOMY 2 LEVELS  lumbar three/four, four/five  ;  Surgeon: JOphelia Charter MD;  [facility];  Service: Neurosurgery;  Laterality: N/A;  ? TONSILLECTOMY  1958  ? ? ?Family History  ?Problem Relation Age of Onset  ? Hypertension Mother   ? Diabetes Mother   ? Hyperlipidemia Mother   ? Heart attack Mother   ? Heart failure Mother   ? Cancer Father   ? Heart attack Father   ? Hyperlipidemia Father   ?  Hypertension Father   ? ? ?Social History  ? ?Tobacco Use  ? Smoking status: Never  ?Substance Use Topics  ? Alcohol use: No  ? Drug use: No  ? ? ?ROS ? ? ?Objective:  ? ?Vitals: ?BP 124/78 (BP Location: Right Arm)   Pulse 87   Temp 97.7 ?F (36.5 ?C) (Oral)   Resp 18   SpO2 97%  ? ?Physical Exam ?Constitutional:   ?   General: He is not in acute distress. ?   Appearance: Normal appearance. He is well-developed and normal weight. He is not ill-appearing, toxic-appearing or diaphoretic.  ?HENT:  ?   Head: Normocephalic and atraumatic.  ?   Right Ear: External ear normal.  ?   Left Ear: External ear normal.  ?   Nose: Nose normal.  ?   Mouth/Throat:  ?   Pharynx: Oropharynx is clear.  ?Eyes:  ?   General: No  scleral icterus.    ?   Right eye: No discharge.     ?   Left eye: No discharge.  ?   Extraocular Movements: Extraocular movements intact.  ?Cardiovascular:  ?   Rate and Rhythm: Normal rate.  ?Pulmonary:  ?   Effort: Pulmonary effort is normal.  ?Musculoskeletal:  ?   Cervical back: Normal range of motion.  ?   Comments: Full range of motion throughout.  Strength 5/5 for lower extremities.  Patient ambulates without any assistance at expected pace.  No ecchymosis, swelling, lacerations or abrasions.  Positive straight leg raise to the left.  Tenderness about the left lower paraspinal muscles extending into the hip and buttock.  ?Skin: ?   General: Skin is warm and dry.  ?Neurological:  ?   Mental Status: He is alert and oriented to person, place, and time.  ?   Motor: No weakness.  ?   Coordination: Coordination normal.  ?   Gait: Gait normal.  ?   Deep Tendon Reflexes: Reflexes normal.  ?Psychiatric:     ?   Mood and Affect: Mood normal.     ?   Behavior: Behavior normal.     ?   Thought Content: Thought content normal.     ?   Judgment: Judgment normal.  ? ? ?MR Lumbar Spine w wo contrast 03/14/2021 ?IMPRESSION: ?1. Interval laminectomies at L3-4 and L4-5 without residual spinal ?stenosis. ?2. Progressive, moderate to severe left neural foraminal stenosis at ?L3-4 and stable to slightly increased moderate neural foraminal ?stenosis at L4-5. ?3. Regression of central disc extrusion at L5-S1 but with overall ?progression of chronically advanced disc degeneration at this level. ?Mild-to-moderate lateral recess and severe right neural foraminal ?stenosis. ?  ?  ?Electronically Signed ?  By: Logan Bores M.D. ?  On: 03/16/2021 10:51 ?  ? ?Assessment and Plan :  ? ?PDMP not reviewed this encounter. ? ?1. Lumbar radiculopathy   ?2. Acute left-sided low back pain with left-sided sciatica   ?3. Type 2 diabetes mellitus treated without insulin ([place])   ? ?Recommended an oral prednisone course in the context of his foraminal  stenosis, degenerative changes and bulging disks.  He needs to follow-up with his spinal specialist as soon as possible.  Emphasized need to follow a diabetic friendly diet given the use of his steroids.  No signs of an acute spinal problem. Counseled patient on potential for adverse effects with medications prescribed/recommended today, ER and return-to-clinic precautions discussed, patient verbalized understanding. ? ?  ?Jaynee Eagles, PA-C ?01/10/22 1152 ? ?

## 2022-01-12 ENCOUNTER — Other Ambulatory Visit: Payer: Self-pay | Admitting: "Endocrinology

## 2022-01-21 DIAGNOSIS — I10 Essential (primary) hypertension: Secondary | ICD-10-CM | POA: Diagnosis not present

## 2022-01-28 DIAGNOSIS — M9905 Segmental and somatic dysfunction of pelvic region: Secondary | ICD-10-CM | POA: Diagnosis not present

## 2022-01-28 DIAGNOSIS — M9903 Segmental and somatic dysfunction of lumbar region: Secondary | ICD-10-CM | POA: Diagnosis not present

## 2022-01-28 DIAGNOSIS — M9902 Segmental and somatic dysfunction of thoracic region: Secondary | ICD-10-CM | POA: Diagnosis not present

## 2022-01-28 DIAGNOSIS — M5442 Lumbago with sciatica, left side: Secondary | ICD-10-CM | POA: Diagnosis not present

## 2022-02-01 DIAGNOSIS — M9903 Segmental and somatic dysfunction of lumbar region: Secondary | ICD-10-CM | POA: Diagnosis not present

## 2022-02-01 DIAGNOSIS — M5442 Lumbago with sciatica, left side: Secondary | ICD-10-CM | POA: Diagnosis not present

## 2022-02-01 DIAGNOSIS — M9902 Segmental and somatic dysfunction of thoracic region: Secondary | ICD-10-CM | POA: Diagnosis not present

## 2022-02-01 DIAGNOSIS — M9905 Segmental and somatic dysfunction of pelvic region: Secondary | ICD-10-CM | POA: Diagnosis not present

## 2022-02-05 DIAGNOSIS — M9905 Segmental and somatic dysfunction of pelvic region: Secondary | ICD-10-CM | POA: Diagnosis not present

## 2022-02-05 DIAGNOSIS — M9903 Segmental and somatic dysfunction of lumbar region: Secondary | ICD-10-CM | POA: Diagnosis not present

## 2022-02-05 DIAGNOSIS — M9902 Segmental and somatic dysfunction of thoracic region: Secondary | ICD-10-CM | POA: Diagnosis not present

## 2022-02-05 DIAGNOSIS — M5442 Lumbago with sciatica, left side: Secondary | ICD-10-CM | POA: Diagnosis not present

## 2022-02-08 DIAGNOSIS — M9903 Segmental and somatic dysfunction of lumbar region: Secondary | ICD-10-CM | POA: Diagnosis not present

## 2022-02-08 DIAGNOSIS — M9902 Segmental and somatic dysfunction of thoracic region: Secondary | ICD-10-CM | POA: Diagnosis not present

## 2022-02-08 DIAGNOSIS — M9905 Segmental and somatic dysfunction of pelvic region: Secondary | ICD-10-CM | POA: Diagnosis not present

## 2022-02-08 DIAGNOSIS — M5442 Lumbago with sciatica, left side: Secondary | ICD-10-CM | POA: Diagnosis not present

## 2022-02-10 DIAGNOSIS — M9903 Segmental and somatic dysfunction of lumbar region: Secondary | ICD-10-CM | POA: Diagnosis not present

## 2022-02-10 DIAGNOSIS — M5442 Lumbago with sciatica, left side: Secondary | ICD-10-CM | POA: Diagnosis not present

## 2022-02-10 DIAGNOSIS — M9902 Segmental and somatic dysfunction of thoracic region: Secondary | ICD-10-CM | POA: Diagnosis not present

## 2022-02-10 DIAGNOSIS — M9905 Segmental and somatic dysfunction of pelvic region: Secondary | ICD-10-CM | POA: Diagnosis not present

## 2022-02-15 DIAGNOSIS — M9905 Segmental and somatic dysfunction of pelvic region: Secondary | ICD-10-CM | POA: Diagnosis not present

## 2022-02-15 DIAGNOSIS — M9902 Segmental and somatic dysfunction of thoracic region: Secondary | ICD-10-CM | POA: Diagnosis not present

## 2022-02-15 DIAGNOSIS — M9903 Segmental and somatic dysfunction of lumbar region: Secondary | ICD-10-CM | POA: Diagnosis not present

## 2022-02-15 DIAGNOSIS — M5442 Lumbago with sciatica, left side: Secondary | ICD-10-CM | POA: Diagnosis not present

## 2022-02-17 ENCOUNTER — Other Ambulatory Visit: Payer: Self-pay | Admitting: "Endocrinology

## 2022-02-17 DIAGNOSIS — E1165 Type 2 diabetes mellitus with hyperglycemia: Secondary | ICD-10-CM

## 2022-02-17 DIAGNOSIS — M9902 Segmental and somatic dysfunction of thoracic region: Secondary | ICD-10-CM | POA: Diagnosis not present

## 2022-02-17 DIAGNOSIS — M9903 Segmental and somatic dysfunction of lumbar region: Secondary | ICD-10-CM | POA: Diagnosis not present

## 2022-02-17 DIAGNOSIS — M9905 Segmental and somatic dysfunction of pelvic region: Secondary | ICD-10-CM | POA: Diagnosis not present

## 2022-02-17 DIAGNOSIS — M5442 Lumbago with sciatica, left side: Secondary | ICD-10-CM | POA: Diagnosis not present

## 2022-02-22 DIAGNOSIS — D2262 Melanocytic nevi of left upper limb, including shoulder: Secondary | ICD-10-CM | POA: Diagnosis not present

## 2022-02-22 DIAGNOSIS — Z85828 Personal history of other malignant neoplasm of skin: Secondary | ICD-10-CM | POA: Diagnosis not present

## 2022-02-22 DIAGNOSIS — D2261 Melanocytic nevi of right upper limb, including shoulder: Secondary | ICD-10-CM | POA: Diagnosis not present

## 2022-02-22 DIAGNOSIS — L218 Other seborrheic dermatitis: Secondary | ICD-10-CM | POA: Diagnosis not present

## 2022-02-22 DIAGNOSIS — L821 Other seborrheic keratosis: Secondary | ICD-10-CM | POA: Diagnosis not present

## 2022-02-22 DIAGNOSIS — D225 Melanocytic nevi of trunk: Secondary | ICD-10-CM | POA: Diagnosis not present

## 2022-02-22 DIAGNOSIS — D485 Neoplasm of uncertain behavior of skin: Secondary | ICD-10-CM | POA: Diagnosis not present

## 2022-02-22 DIAGNOSIS — D1801 Hemangioma of skin and subcutaneous tissue: Secondary | ICD-10-CM | POA: Diagnosis not present

## 2022-02-22 DIAGNOSIS — L570 Actinic keratosis: Secondary | ICD-10-CM | POA: Diagnosis not present

## 2022-02-22 DIAGNOSIS — L814 Other melanin hyperpigmentation: Secondary | ICD-10-CM | POA: Diagnosis not present

## 2022-02-24 DIAGNOSIS — M5442 Lumbago with sciatica, left side: Secondary | ICD-10-CM | POA: Diagnosis not present

## 2022-02-24 DIAGNOSIS — M9903 Segmental and somatic dysfunction of lumbar region: Secondary | ICD-10-CM | POA: Diagnosis not present

## 2022-02-24 DIAGNOSIS — M9905 Segmental and somatic dysfunction of pelvic region: Secondary | ICD-10-CM | POA: Diagnosis not present

## 2022-02-24 DIAGNOSIS — M9902 Segmental and somatic dysfunction of thoracic region: Secondary | ICD-10-CM | POA: Diagnosis not present

## 2022-03-03 DIAGNOSIS — M9905 Segmental and somatic dysfunction of pelvic region: Secondary | ICD-10-CM | POA: Diagnosis not present

## 2022-03-03 DIAGNOSIS — M5442 Lumbago with sciatica, left side: Secondary | ICD-10-CM | POA: Diagnosis not present

## 2022-03-03 DIAGNOSIS — M9902 Segmental and somatic dysfunction of thoracic region: Secondary | ICD-10-CM | POA: Diagnosis not present

## 2022-03-03 DIAGNOSIS — M9903 Segmental and somatic dysfunction of lumbar region: Secondary | ICD-10-CM | POA: Diagnosis not present

## 2022-03-10 DIAGNOSIS — M9903 Segmental and somatic dysfunction of lumbar region: Secondary | ICD-10-CM | POA: Diagnosis not present

## 2022-03-10 DIAGNOSIS — M5442 Lumbago with sciatica, left side: Secondary | ICD-10-CM | POA: Diagnosis not present

## 2022-03-10 DIAGNOSIS — M9902 Segmental and somatic dysfunction of thoracic region: Secondary | ICD-10-CM | POA: Diagnosis not present

## 2022-03-10 DIAGNOSIS — M9905 Segmental and somatic dysfunction of pelvic region: Secondary | ICD-10-CM | POA: Diagnosis not present

## 2022-03-24 DIAGNOSIS — M5442 Lumbago with sciatica, left side: Secondary | ICD-10-CM | POA: Diagnosis not present

## 2022-03-24 DIAGNOSIS — M9905 Segmental and somatic dysfunction of pelvic region: Secondary | ICD-10-CM | POA: Diagnosis not present

## 2022-03-24 DIAGNOSIS — M9903 Segmental and somatic dysfunction of lumbar region: Secondary | ICD-10-CM | POA: Diagnosis not present

## 2022-03-24 DIAGNOSIS — M9902 Segmental and somatic dysfunction of thoracic region: Secondary | ICD-10-CM | POA: Diagnosis not present

## 2022-03-24 DIAGNOSIS — E1165 Type 2 diabetes mellitus with hyperglycemia: Secondary | ICD-10-CM | POA: Diagnosis not present

## 2022-03-25 LAB — COMPREHENSIVE METABOLIC PANEL
ALT: 34 IU/L (ref 0–44)
AST: 16 IU/L (ref 0–40)
Albumin/Globulin Ratio: 2.3 — ABNORMAL HIGH (ref 1.2–2.2)
Albumin: 4.4 g/dL (ref 3.8–4.8)
Alkaline Phosphatase: 68 IU/L (ref 44–121)
BUN/Creatinine Ratio: 13 (ref 10–24)
BUN: 14 mg/dL (ref 8–27)
Bilirubin Total: 1.1 mg/dL (ref 0.0–1.2)
CO2: 23 mmol/L (ref 20–29)
Calcium: 9.9 mg/dL (ref 8.6–10.2)
Chloride: 104 mmol/L (ref 96–106)
Creatinine, Ser: 1.05 mg/dL (ref 0.76–1.27)
Globulin, Total: 1.9 g/dL (ref 1.5–4.5)
Glucose: 142 mg/dL — ABNORMAL HIGH (ref 70–99)
Potassium: 4.3 mmol/L (ref 3.5–5.2)
Sodium: 143 mmol/L (ref 134–144)
Total Protein: 6.3 g/dL (ref 6.0–8.5)
eGFR: 77 mL/min/{1.73_m2} (ref >59)

## 2022-03-25 LAB — LIPID PANEL
Chol/HDL Ratio: 2.8 ratio (ref 0.0–5.0)
Cholesterol, Total: 122 mg/dL (ref 100–199)
HDL: 43 mg/dL (ref >39)
LDL Chol Calc (NIH): 55 mg/dL (ref 0–99)
Triglycerides: 137 mg/dL (ref 0–149)
VLDL Cholesterol Cal: 24 mg/dL (ref 5–40)

## 2022-03-25 LAB — T4, FREE: Free T4: 1.21 ng/dL (ref 0.82–1.77)

## 2022-03-25 LAB — TSH: TSH: 2.35 u[IU]/mL (ref 0.450–4.500)

## 2022-03-29 ENCOUNTER — Ambulatory Visit: Payer: HMO | Admitting: "Endocrinology

## 2022-04-01 ENCOUNTER — Encounter: Payer: Self-pay | Admitting: "Endocrinology

## 2022-04-01 ENCOUNTER — Ambulatory Visit: Payer: HMO | Admitting: "Endocrinology

## 2022-04-01 VITALS — BP 108/74 | HR 60 | Ht 70.0 in | Wt 182.8 lb

## 2022-04-01 DIAGNOSIS — E1165 Type 2 diabetes mellitus with hyperglycemia: Secondary | ICD-10-CM | POA: Diagnosis not present

## 2022-04-01 LAB — POCT GLYCOSYLATED HEMOGLOBIN (HGB A1C): HbA1c, POC (controlled diabetic range): 7.2 % — AB (ref 0.0–7.0)

## 2022-04-01 MED ORDER — SEMAGLUTIDE (2 MG/DOSE) 8 MG/3ML ~~LOC~~ SOPN: 2.0000 mg | PEN_INJECTOR | SUBCUTANEOUS | 2 refills | Status: DC

## 2022-04-01 NOTE — Patient Instructions (Signed)

## 2022-04-01 NOTE — Progress Notes (Signed)
04/01/2022, 12:46 PM  Endocrinology follow-up note   Subjective:    Patient ID: Mario [name], male    DOB: [birth year].  Mario [name] is being seen in follow-up after he was seen in consultation for  management of currently uncontrolled symptomatic diabetes requested by  [place], [place] @ [facility].   Past Medical History:  Diagnosis Date   Chronic back pain    radiculopatly  and stenosis   Complication of anesthesia    woke up being very agitated   Diabetes mellitus without complication ([place])    takes Metformin and Amaryl daily   History of gout 36yr ago   History of kidney stones 30+yrs ago   History of shingles 1990   Hyperlipidemia    takes Atorvastatin daily   Rosacea    uses Metronidazole daily    Past Surgical History:  Procedure Laterality Date   CERVICAL FUSION  1998   COLONOSCOPY     LUMBAR LAMINECTOMY/DECOMPRESSION MICRODISCECTOMY N/A 04/15/2014   Procedure: LUMBAR LAMINECTOMY/DECOMPRESSION MICRODISCECTOMY 2 LEVELS  lumbar three/four, four/five  ;  Surgeon: JOphelia Charter MD;  [facility];  Service: Neurosurgery;  Laterality: N/A;   TONSILLECTOMY  1958    Social History   Socioeconomic History   Marital status: Married    Spouse name: Not on file   Number of children: Not on file   Years of education: Not on file   Highest education level: Not on file  Occupational History   Not on file  Tobacco Use   Smoking status: Never   Smokeless tobacco: Not on file  Substance and Sexual Activity   Alcohol use: No   Drug use: No   Sexual activity: Yes  Other Topics Concern   Not on file  Social History Narrative   Not on file   Social Determinants of Health   Financial Resource Strain: Not on file  Food Insecurity: Not on file  Transportation Needs: Not on file  Physical Activity: Not on file  Stress: Not on file  Social Connections: Not on  file    Family History  Problem Relation Age of Onset   Hypertension Mother    Diabetes Mother    Hyperlipidemia Mother    Heart attack Mother    Heart failure Mother    Cancer Father    Heart attack Father    Hyperlipidemia Father    Hypertension Father     Outpatient Encounter Medications as of 04/01/2022  Medication Sig   Semaglutide, 2 MG/DOSE, 8 MG/3ML SOPN Inject 2 mg as directed once a week.   aspirin EC 325 MG tablet Take 325 mg by mouth daily.   atorvastatin (LIPITOR) 40 MG tablet Take 40 mg by mouth at bedtime.   Continuous Blood Gluc Receiver (FREESTYLE LIBRE 2 READER) DEVI 1 each by Does not apply route as directed. Use to check glucose as directed   Continuous Blood Gluc Sensor (FREESTYLE LIBRE 2 SENSOR) MISC CHANGE SENSOR EVERY 14 DAYS   hydrochlorothiazide (MICROZIDE) 12.5 MG capsule Take 12.5 mg by mouth every morning.   Ibuprofen-Diphenhydramine Cit (ADVIL PM PO) Take 2 tablets  by mouth at bedtime.   metFORMIN (GLUCOPHAGE) 1000 MG tablet Take 1,000 mg by mouth 2 (two) times daily with a meal.   metroNIDAZOLE (METROCREAM) 0.75 % cream Apply 1 application topically 2 (two) times daily. To face   olmesartan (BENICAR) 20 MG tablet Take 40 mg by mouth daily.   Omega-3 Fatty Acids (FISH OIL) 1200 MG CAPS Take 2,400 mg by mouth daily.   predniSONE (DELTASONE) 50 MG tablet Take 1 tablet (50 mg total) by mouth daily with breakfast.   [DISCONTINUED] JARDIANCE 10 MG TABS tablet TAKE ONE TABLET BY MOUTH EVERY MORNING   [DISCONTINUED] OZEMPIC, 1 MG/DOSE, 4 MG/3ML SOPN INJECT ONE MG into THE SKIN ONCE A WEEK   No facility-administered encounter medications on file as of 04/01/2022.    ALLERGIES: Allergies  Allergen Reactions   Adhesive [Tape]     Breaks out    VACCINATION STATUS: Immunization History  Administered Date(s) Administered   Moderna Covid-19 Vaccine Bivalent Booster 41yr & up 07/28/2021   Moderna Sars-Covid-2 Vaccination 12/07/2019, 01/07/2020     Diabetes He presents for his follow-up diabetic visit. He has type 2 diabetes mellitus. Onset time: He was diagnosed at approximate age of 89years. His disease course has been improving. There are no hypoglycemic associated symptoms. Pertinent negatives for hypoglycemia include no confusion, headaches, pallor or seizures. Pertinent negatives for diabetes include no chest pain, no fatigue, no polydipsia, no polyphagia, no polyuria and no weakness. There are no hypoglycemic complications. Symptoms are improving. There are no diabetic complications. Risk factors for coronary artery disease include diabetes mellitus, dyslipidemia, hypertension, male sex and family history. Current diabetic treatments: He is currently on Ozempic 0.5 mg weekly, Jardiance 25 mg p.o. daily, and metformin 1000 mg p.o. twice daily. His weight is decreasing steadily (Patient gives history of weighing as much as 300 pounds, progressively lost close to 100 pounds.  He continues to lose 30 pounds since his last visit.). When asked about meal planning, he reported none. His home blood glucose trend is decreasing steadily. His breakfast blood glucose range is generally 140-180 mg/dl. His lunch blood glucose range is generally 140-180 mg/dl. His dinner blood glucose range is generally 140-180 mg/dl. His bedtime blood glucose range is generally 140-180 mg/dl. His overall blood glucose range is 140-180 mg/dl. (Mario Beehlerpresents with his CGM device showing continued improvement in his glycemic profile.  AGP report shows 61% time range, 35% level 1 hyperglycemia.  4% level 2 hyperglycemia, no high Proglycem.  His point-of-care A1c is 7.2%  overall improving from 10.8%.  Over the last 14 days his average blood glucose is 174.) An ACE inhibitor/angiotensin II receptor blocker is being taken. Eye exam is current.  Hyperlipidemia This is a chronic problem. The problem is uncontrolled. Exacerbating diseases include diabetes. Pertinent negatives  include no chest pain, myalgias or shortness of breath. Current antihyperlipidemic treatment includes statins. Risk factors for coronary artery disease include diabetes mellitus, dyslipidemia, hypertension, male sex and a sedentary lifestyle.  Hypertension This is a chronic problem. The problem is controlled. Pertinent negatives include no chest pain, headaches, neck pain, palpitations or shortness of breath. Risk factors for coronary artery disease include dyslipidemia, diabetes mellitus, family history, male gender and sedentary lifestyle. Past treatments include diuretics and angiotensin blockers.     Review of Systems  Constitutional:  Negative for chills, fatigue, fever and unexpected weight change.  HENT:  Negative for dental problem, mouth sores and trouble swallowing.   Eyes:  Negative for visual disturbance.  Respiratory:  Negative for cough, choking, chest tightness, shortness of breath and wheezing.   Cardiovascular:  Negative for chest pain, palpitations and leg swelling.  Gastrointestinal:  Negative for abdominal distention, abdominal pain, constipation, diarrhea, nausea and vomiting.  Endocrine: Negative for polydipsia, polyphagia and polyuria.  Genitourinary:  Negative for dysuria, flank pain, hematuria and urgency.  Musculoskeletal:  Negative for back pain, gait problem, myalgias and neck pain.  Skin:  Negative for pallor, rash and wound.  Neurological:  Negative for seizures, syncope, weakness, numbness and headaches.  Psychiatric/Behavioral:  Negative for confusion and dysphoric mood.     Objective:       04/01/2022    8:11 AM 01/10/2022   11:06 AM 12/25/2021   11:06 AM  Vitals with BMI  Height '5\' 10"'$   '5\' 10"'$   Weight 182 lbs 13 oz  183 lbs  BMI 38.25  05.39  Systolic 767 341 937  Diastolic 74 78 87  Pulse 60 87 95    BP 108/74   Pulse 60   Ht '5\' 10"'$  (1.778 m)   Wt 182 lb 12.8 oz (82.9 kg)   BMI 26.23 kg/m   Wt Readings from Last 3 Encounters:  04/01/22 182 lb  12.8 oz (82.9 kg)  12/25/21 183 lb (83 kg)  11/26/21 186 lb 12.8 oz (84.7 kg)      CMP ( most recent) CMP     Component Value Date/Time   NA 143 03/24/2022 0836   K 4.3 03/24/2022 0836   CL 104 03/24/2022 0836   CO2 23 03/24/2022 0836   GLUCOSE 142 (H) 03/24/2022 0836   GLUCOSE 106 (H) 04/05/2014 0908   BUN 14 03/24/2022 0836   CREATININE 1.05 03/24/2022 0836   CALCIUM 9.9 03/24/2022 0836   PROT 6.3 03/24/2022 0836   ALBUMIN 4.4 03/24/2022 0836   AST 16 03/24/2022 0836   ALT 34 03/24/2022 0836   ALKPHOS 68 03/24/2022 0836   BILITOT 1.1 03/24/2022 0836   GFRNONAA >90 04/05/2014 0908   GFRAA >90 04/05/2014 0908     Diabetic Labs (most recent): Lab Results  Component Value Date   HGBA1C 7.2 (A) 04/01/2022   HGBA1C 7.3 (A) 11/26/2021   HGBA1C 8.7 08/21/2021     Lipid Panel ( most recent) Lipid Panel     Component Value Date/Time   CHOL 122 03/24/2022 0836   TRIG 137 03/24/2022 0836   HDL 43 03/24/2022 0836   CHOLHDL 2.8 03/24/2022 0836   LDLCALC 55 03/24/2022 0836   LABVLDL 24 03/24/2022 0836        Assessment & Plan:   Uncontrolled type 2 diabetes  - Mario [name] has currently uncontrolled symptomatic type 2 DM since  71 years of age.   Nabeel presents with his CGM device showing continued improvement in his glycemic profile.  AGP report shows 61% time range, 35% level 1 hyperglycemia.  4% level 2 hyperglycemia, no high Proglycem.  His point-of-care A1c is 7.2%  overall improving from 10.8%.  Over the last   Recent labs reviewed. - I had a long discussion with him about the progressive nature of diabetes and the pathology behind its complications. -He does not report any gross complications, however he remains at a high risk for more acute and chronic complications which include CAD, CVA, CKD, retinopathy, and neuropathy. These are all discussed in detail with him.  -  he is advised to stick to a routine mealtimes to eat 3 meals  a day and avoid  unnecessary snacks ( to snack only to correct hypoglycemia).   - he acknowledges that there is a room for improvement in his food and drink choices. - Suggestion is made for him to avoid simple carbohydrates  from his diet including Cakes, Sweet Desserts, Ice Cream, Soda (diet and regular), Sweet Tea, Candies, Chips, Cookies, Store Bought Juices, Alcohol , Artificial Sweeteners,  Coffee Creamer, and "Sugar-free" Products, Lemonade. This will help patient to have more stable blood glucose profile and potentially avoid unintended weight gain.  The following Lifestyle Medicine recommendations according to [place]  [facility]) were discussed and and offered to patient and he  agrees to start the journey:  A. Whole Foods, Plant-Based Nutrition comprising of fruits and vegetables, plant-based proteins, whole-grain carbohydrates was discussed in detail with the patient.   A list for source of those nutrients were also provided to the patient.  Patient will use only water or unsweetened tea for hydration. B.  The need to stay away from risky substances including alcohol, smoking; obtaining 7 to 9 hours of restorative sleep, at least 150 minutes of moderate intensity exercise weekly, the importance of healthy social connections,  and stress management techniques were discussed. C.  A full color page of  Calorie density of various food groups per pound showing examples of each food groups was provided to the patient.   - he will be scheduled with Jearld Fenton, RDN, CDE for diabetes education.  - I have approached him with the following individualized plan to manage  his diabetes and patient agrees:   -He continues to respond to lifestyle medicine achieving more control with less medications.  He is advised to continue to use his CGM continuously.  He is advised to increase Ozempic to 2 mg subcutaneously weekly, metformin 1000 mg p.o. twice daily.  Due to persistent genital rash, he  is advised to finish his Jardiance supplies and discontinue after that.    - he is encouraged to call clinic for blood glucose levels less than 70 or above 200 mg /dl at fasting. - Specific targets for  A1c;  LDL, HDL,  and Triglycerides were discussed with the patient.  2) Blood Pressure /Hypertension:   -His blood pressure is controlled to target.  he is advised to continue his current medications including hydrochlorothiazide 12.5 mg p.o. daily mg p.o. daily with breakfast .  He is also on Benicar.  Since he would benefit from Benicar, he will be advised to discontinue hydrochlorothiazide if he returns with marginal blood pressure.  3) Lipids/Hyperlipidemia:   Review of his recent lipid panel showed improved triglyceride level to 162 from 269.  And his LDL is 55, improving from 77.    He is advised to continue atorvastatin 40 mg p.o. nightly.   Side effects and precautions discussed with him.    4)  Weight/Diet: He has achieved impressive 37 pounds of weight loss   intentionally over the last year.  Overall this patient lost close to 150 pounds over the years, progressively.  Body mass index is 26.23 kg/m.  He has achieved adequate weight loss already,    he is not a candidate for major weight loss.    I discussed with him the fact that loss of 5 - 10% of his  current body weight will have the most impact on his diabetes management.  Exercise, and detailed carbohydrates information provided  -  detailed on discharge instructions.  5) Chronic Care/Health Maintenance:  -he  is on ACEI/ARB and Statin medications and  is encouraged to initiate and continue to follow up with Ophthalmology, Dentist,  Podiatrist at least yearly or according to recommendations, and advised to   stay away from smoking. I have recommended yearly flu vaccine and pneumonia vaccine at least every 5 years; moderate intensity exercise for up to 150 minutes weekly; and  sleep for at least 7 hours a day.  - he is  advised to  maintain close follow up with [facility] family medicine for primary care needs,  as well as his other providers for optimal and coordinated care.  I spent 41 minutes in the care of the patient today including review of labs from [place], Lipids, Thyroid Function, Hematology (current and previous including abstractions from other facilities); face-to-face time discussing  his blood glucose readings/logs, discussing hypoglycemia and hyperglycemia episodes and symptoms, medications doses, his options of short and long term treatment based on the latest standards of care / guidelines;  discussion about incorporating lifestyle medicine;  and documenting the encounter.    Please refer to Patient Instructions for Blood Glucose Monitoring and Insulin/Medications Dosing Guide"  in media tab for additional information. Please  also refer to " Patient Self Inventory" in the Media  tab for reviewed elements of pertinent patient history.  Mario [name] participated in the discussions, expressed understanding, and voiced agreement with the above plans.  All questions were answered to his satisfaction. he is encouraged to contact clinic should he have any questions or concerns prior to his return visit.   Follow up plan: - Return in about 4 months (around 08/01/2022) for Bring Meter/CGM Device/Logs- A1c in Office, Urine MA - [place].  Glade Lloyd, MD [facility] [address] [place], [place] [address] Phone: [phone]  Fax: [phone]    04/01/2022, 12:46 PM  This note was partially dictated with voice recognition software. Similar sounding words can be transcribed inadequately or may not  be corrected upon review.

## 2022-04-02 ENCOUNTER — Telehealth: Payer: Self-pay | Admitting: "Endocrinology

## 2022-04-02 DIAGNOSIS — E1165 Type 2 diabetes mellitus with hyperglycemia: Secondary | ICD-10-CM

## 2022-04-02 MED ORDER — SEMAGLUTIDE (2 MG/DOSE) 8 MG/3ML ~~LOC~~ SOPN: 2.0000 mg | PEN_INJECTOR | SUBCUTANEOUS | 2 refills | Status: DC

## 2022-04-02 NOTE — Telephone Encounter (Signed)
Rx refill sent to [facility]. 

## 2022-04-02 NOTE — Telephone Encounter (Signed)
Patient said that he no longer uses [facility], [facility] now [facility]. Can you change his ozempic to [facility]. I have removed [facility] form the chart

## 2022-04-07 DIAGNOSIS — M9903 Segmental and somatic dysfunction of lumbar region: Secondary | ICD-10-CM | POA: Diagnosis not present

## 2022-04-07 DIAGNOSIS — M5442 Lumbago with sciatica, left side: Secondary | ICD-10-CM | POA: Diagnosis not present

## 2022-04-07 DIAGNOSIS — M9902 Segmental and somatic dysfunction of thoracic region: Secondary | ICD-10-CM | POA: Diagnosis not present

## 2022-04-07 DIAGNOSIS — M9905 Segmental and somatic dysfunction of pelvic region: Secondary | ICD-10-CM | POA: Diagnosis not present

## 2022-05-05 DIAGNOSIS — M9905 Segmental and somatic dysfunction of pelvic region: Secondary | ICD-10-CM | POA: Diagnosis not present

## 2022-05-05 DIAGNOSIS — M9902 Segmental and somatic dysfunction of thoracic region: Secondary | ICD-10-CM | POA: Diagnosis not present

## 2022-05-05 DIAGNOSIS — M9903 Segmental and somatic dysfunction of lumbar region: Secondary | ICD-10-CM | POA: Diagnosis not present

## 2022-05-05 DIAGNOSIS — M5442 Lumbago with sciatica, left side: Secondary | ICD-10-CM | POA: Diagnosis not present

## 2022-05-11 ENCOUNTER — Other Ambulatory Visit: Payer: Self-pay | Admitting: "Endocrinology

## 2022-05-11 DIAGNOSIS — E1165 Type 2 diabetes mellitus with hyperglycemia: Secondary | ICD-10-CM

## 2022-06-02 DIAGNOSIS — M9903 Segmental and somatic dysfunction of lumbar region: Secondary | ICD-10-CM | POA: Diagnosis not present

## 2022-06-02 DIAGNOSIS — M9905 Segmental and somatic dysfunction of pelvic region: Secondary | ICD-10-CM | POA: Diagnosis not present

## 2022-06-02 DIAGNOSIS — M9902 Segmental and somatic dysfunction of thoracic region: Secondary | ICD-10-CM | POA: Diagnosis not present

## 2022-06-02 DIAGNOSIS — M5442 Lumbago with sciatica, left side: Secondary | ICD-10-CM | POA: Diagnosis not present

## 2022-06-30 DIAGNOSIS — M9905 Segmental and somatic dysfunction of pelvic region: Secondary | ICD-10-CM | POA: Diagnosis not present

## 2022-06-30 DIAGNOSIS — M9902 Segmental and somatic dysfunction of thoracic region: Secondary | ICD-10-CM | POA: Diagnosis not present

## 2022-06-30 DIAGNOSIS — M5442 Lumbago with sciatica, left side: Secondary | ICD-10-CM | POA: Diagnosis not present

## 2022-06-30 DIAGNOSIS — M9903 Segmental and somatic dysfunction of lumbar region: Secondary | ICD-10-CM | POA: Diagnosis not present

## 2022-07-30 ENCOUNTER — Other Ambulatory Visit: Payer: Self-pay | Admitting: "Endocrinology

## 2022-07-30 DIAGNOSIS — E1165 Type 2 diabetes mellitus with hyperglycemia: Secondary | ICD-10-CM

## 2022-08-03 ENCOUNTER — Encounter: Payer: Self-pay | Admitting: "Endocrinology

## 2022-08-03 ENCOUNTER — Ambulatory Visit: Payer: HMO | Admitting: "Endocrinology

## 2022-08-03 VITALS — BP 124/76 | HR 64 | Ht 70.0 in | Wt 187.0 lb

## 2022-08-03 DIAGNOSIS — E782 Mixed hyperlipidemia: Secondary | ICD-10-CM | POA: Diagnosis not present

## 2022-08-03 DIAGNOSIS — I10 Essential (primary) hypertension: Secondary | ICD-10-CM

## 2022-08-03 DIAGNOSIS — E1165 Type 2 diabetes mellitus with hyperglycemia: Secondary | ICD-10-CM

## 2022-08-03 LAB — POCT UA - MICROALBUMIN
Albumin/Creatinine Ratio, Urine, POC: <30
Creatinine, POC: 300 mg/dL
Microalbumin Ur, POC: 30 mg/L

## 2022-08-03 LAB — POCT GLYCOSYLATED HEMOGLOBIN (HGB A1C): HbA1c, POC (controlled diabetic range): 7.5 % — AB (ref 0.0–7.0)

## 2022-08-03 MED ORDER — SEMAGLUTIDE (1 MG/DOSE) 4 MG/3ML ~~LOC~~ SOPN: 1.0000 mg | PEN_INJECTOR | SUBCUTANEOUS | 2 refills | Status: DC

## 2022-08-03 NOTE — Patient Instructions (Signed)
                                     Advice for Weight Management  -For most of us the best way to lose weight is by diet management. Generally speaking, diet management means consuming less calories intentionally which over time brings about progressive weight loss.  This can be achieved more effectively by avoiding ultra processed carbohydrates, processed meats, unhealthy fats.    It is critically important to know your numbers: how much calorie you are consuming and how much calorie you need. More importantly, our carbohydrates sources should be unprocessed naturally occurring  complex starch food items.  It is always important to balance nutrition also by  appropriate intake of proteins (mainly plant-based), healthy fats/oils, plenty of fruits and vegetables.   -The American College of Lifestyle Medicine (ACL M) recommends nutrition derived mostly from Whole Food, Plant Predominant Sources example an apple instead of applesauce or apple pie. Eat Plenty of vegetables, Mushrooms, fruits, Legumes, Whole Grains, Nuts, seeds in lieu of processed meats, processed snacks/pastries red meat, poultry, eggs.  Use only water or unsweetened tea for hydration.  The College also recommends the need to stay away from risky substances including alcohol, smoking; obtaining 7-9 hours of restorative sleep, at least 150 minutes of moderate intensity exercise weekly, importance of healthy social connections, and being mindful of stress and seek help when it is overwhelming.    -Sticking to a routine mealtime to eat 3 meals a day and avoiding unnecessary snacks is shown to have a big role in weight control. Under normal circumstances, the only time we burn stored energy is when we are hungry, so allow  some hunger to take place- hunger means no food between appropriate meal times, only water.  It is not advisable to starve.   -It is better to avoid simple carbohydrates including:  Cakes, Sweet Desserts, Ice Cream, Soda (diet and regular), Sweet Tea, Candies, Chips, Cookies, Store Bought Juices, Alcohol in Excess of  1-2 drinks a day, Lemonade,  Artificial Sweeteners, Doughnuts, Coffee Creamers, "Sugar-free" Products, etc, etc.  This is not a complete list.....    -Consulting with certified diabetes educators is proven to provide you with the most accurate and current information on diet.  Also, you may be  interested in discussing diet options/exchanges , we can schedule a visit with Mario [name], RDN, CDE for individualized nutrition education.  -Exercise: If you are able: 30 -60 minutes a day ,4 days a week, or 150 minutes of moderate intensity exercise weekly.    The longer the better if tolerated.  Combine stretch, strength, and aerobic activities.  If you were told in the past that you have high risk for cardiovascular diseases, or if you are currently symptomatic, you may seek evaluation by your heart doctor prior to initiating moderate to intense exercise programs.                                  Additional Care Considerations for Diabetes/Prediabetes   -Diabetes  is a chronic disease.  The most important care consideration is regular follow-up with your diabetes care provider with the goal being avoiding or delaying its complications and to take advantage of advances in medications and technology.  If appropriate actions are taken early enough, type 2 diabetes can even be   reversed.  Seek information from the right source.  - Whole Food, Plant Predominant Nutrition is highly recommended: Eat Plenty of vegetables, Mushrooms, fruits, Legumes, Whole Grains, Nuts, seeds in lieu of processed meats, processed snacks/pastries red meat, poultry, eggs as recommended by American College of  Lifestyle Medicine (ACLM).  -Type 2 diabetes is known to coexist with other important comorbidities such as high blood pressure and high cholesterol.  It is critical to control not only the  diabetes but also the high blood pressure and high cholesterol to minimize and delay the risk of complications including coronary artery disease, stroke, amputations, blindness, etc.  The good news is that this diet recommendation for type 2 diabetes is also very helpful for managing high cholesterol and high blood blood pressure.  - Studies showed that people with diabetes will benefit from a class of medications known as ACE inhibitors and statins.  Unless there are specific reasons not to be on these medications, the standard of care is to consider getting one from these groups of medications at an optimal doses.  These medications are generally considered safe and proven to help protect the heart and the kidneys.    - People with diabetes are encouraged to initiate and maintain regular follow-up with eye doctors, foot doctors, dentists , and if necessary heart and kidney doctors.     - It is highly recommended that people with diabetes quit smoking or stay away from smoking, and get yearly  flu vaccine and pneumonia vaccine at least every 5 years.  See above for additional recommendations on exercise, sleep, stress management , and healthy social connections.      

## 2022-08-03 NOTE — Progress Notes (Signed)
08/03/2022, 12:52 PM  Endocrinology follow-up note   Subjective:    Patient ID: Mario [name], male    DOB: 03-02-1951.  Mario [name] is being seen in follow-up after he was seen in consultation for  management of currently uncontrolled symptomatic diabetes requested by  [place], [place] @ [place].   Past Medical History:  Diagnosis Date   Chronic back pain    radiculopatly  and stenosis   Complication of anesthesia    woke up being very agitated   Diabetes mellitus without complication ([place])    takes Metformin and Amaryl daily   History of gout 61yr ago   History of kidney stones 30+yrs ago   History of shingles 1990   Hyperlipidemia    takes Atorvastatin daily   Rosacea    uses Metronidazole daily    Past Surgical History:  Procedure Laterality Date   CERVICAL FUSION  1998   COLONOSCOPY     LUMBAR LAMINECTOMY/DECOMPRESSION MICRODISCECTOMY N/A 04/15/2014   Procedure: LUMBAR LAMINECTOMY/DECOMPRESSION MICRODISCECTOMY 2 LEVELS  lumbar three/four, four/five  ;  Surgeon: JOphelia Charter MD;  [facility];  Service: Neurosurgery;  Laterality: N/A;   TONSILLECTOMY  1958    Social History   Socioeconomic History   Marital status: Married    Spouse name: Not on file   Number of children: Not on file   Years of education: Not on file   Highest education level: Not on file  Occupational History   Not on file  Tobacco Use   Smoking status: Never   Smokeless tobacco: Not on file  Substance and Sexual Activity   Alcohol use: No   Drug use: No   Sexual activity: Yes  Other Topics Concern   Not on file  Social History Narrative   Not on file   Social Determinants of Health   Financial Resource Strain: Not on file  Food Insecurity: Not on file  Transportation Needs: Not on file  Physical Activity: Not on file  Stress: Not on file  Social Connections: Not on  file    Family History  Problem Relation Age of Onset   Hypertension Mother    Diabetes Mother    Hyperlipidemia Mother    Heart attack Mother    Heart failure Mother    Cancer Father    Heart attack Father    Hyperlipidemia Father    Hypertension Father     Outpatient Encounter Medications as of 08/03/2022  Medication Sig   Semaglutide, 1 MG/DOSE, 4 MG/3ML SOPN Inject 1 mg as directed once a week.   aspirin EC 325 MG tablet Take 325 mg by mouth daily.   atorvastatin (LIPITOR) 40 MG tablet Take 40 mg by mouth at bedtime.   Continuous Blood Gluc Receiver (FREESTYLE LIBRE 2 READER) DEVI 1 each by Does not apply route as directed. Use to check glucose as directed   Continuous Blood Gluc Sensor (FREESTYLE LIBRE 2 SENSOR) MISC USE TO CHECK BLOOD SUGAR AS DIRECTED. CHANGE SENSOR EVERY 14 DAYS.   hydrochlorothiazide (MICROZIDE) 12.5 MG capsule Take 12.5 mg by mouth every morning.   Ibuprofen-Diphenhydramine  Cit (ADVIL PM PO) Take 2 tablets by mouth at bedtime.   metFORMIN (GLUCOPHAGE) 1000 MG tablet Take 1,000 mg by mouth 2 (two) times daily with a meal.   metroNIDAZOLE (METROCREAM) 0.75 % cream Apply 1 application topically 2 (two) times daily. To face   olmesartan (BENICAR) 20 MG tablet Take 40 mg by mouth daily.   Omega-3 Fatty Acids (FISH OIL) 1200 MG CAPS Take 2,400 mg by mouth daily.   [DISCONTINUED] predniSONE (DELTASONE) 50 MG tablet Take 1 tablet (50 mg total) by mouth daily with breakfast.   [DISCONTINUED] Semaglutide, 2 MG/DOSE, 8 MG/3ML SOPN Inject 2 mg as directed once a week.   No facility-administered encounter medications on file as of 08/03/2022.    ALLERGIES: Allergies  Allergen Reactions   Adhesive [Tape]     Breaks out    VACCINATION STATUS: Immunization History  Administered Date(s) Administered   Moderna Covid-19 Vaccine Bivalent Booster 22yr & up 07/28/2021   Moderna Sars-Covid-2 Vaccination 12/07/2019, 01/07/2020    Diabetes He presents for his  follow-up diabetic visit. He has type 2 diabetes mellitus. Onset time: He was diagnosed at approximate age of 89years. His disease course has been worsening. There are no hypoglycemic associated symptoms. Pertinent negatives for hypoglycemia include no confusion, headaches, pallor or seizures. Pertinent negatives for diabetes include no chest pain, no fatigue, no polydipsia, no polyphagia, no polyuria and no weakness. There are no hypoglycemic complications. Symptoms are worsening. There are no diabetic complications. Risk factors for coronary artery disease include diabetes mellitus, dyslipidemia, hypertension, male sex and family history. Current diabetic treatments: He is currently on Ozempic 2 mg weeklyand metformin 1000 mg p.o. twice daily. His weight is increasing steadily (Patient gives history of weighing as much as 300 pounds in the past.  She was progressively losing weight until last visit.  He returns with a few pounds of weight gain this time.). He is following a generally unhealthy diet. When asked about meal planning, he reported none. His home blood glucose trend is fluctuating minimally. His breakfast blood glucose range is generally 140-180 mg/dl. His lunch blood glucose range is generally 140-180 mg/dl. His dinner blood glucose range is generally 140-180 mg/dl. His bedtime blood glucose range is generally 180-200 mg/dl. His overall blood glucose range is 140-180 mg/dl. (Mario Wiebelhauspresents with his CGM device showing above target glycemic profile.  His average blood glucose is 174 for the last 7 days.  Mainly due to the fact that he could not secure his Ozempic for the last 3 weeks.  His AGP report shows 58% time range, 38% level 1 hyperglycemia, 4% level 2 hyperglycemia.  There is no hypoglycemia.  His point-of-care A1c is 7.5% increasing from 7.2%.  He is an overall improvement from 10.8%.  ) An ACE inhibitor/angiotensin II receptor blocker is being taken. Eye exam is current.  Hyperlipidemia This  is a chronic problem. The problem is uncontrolled. Exacerbating diseases include diabetes. Pertinent negatives include no chest pain, myalgias or shortness of breath. Current antihyperlipidemic treatment includes statins. Risk factors for coronary artery disease include diabetes mellitus, dyslipidemia, hypertension, male sex and a sedentary lifestyle.  Hypertension This is a chronic problem. The problem is controlled. Pertinent negatives include no chest pain, headaches, neck pain, palpitations or shortness of breath. Risk factors for coronary artery disease include dyslipidemia, diabetes mellitus, family history, male gender and sedentary lifestyle. Past treatments include diuretics and angiotensin blockers.     Review of Systems  Constitutional:  Negative for chills, fatigue,  fever and unexpected weight change.  HENT:  Negative for dental problem, mouth sores and trouble swallowing.   Eyes:  Negative for visual disturbance.  Respiratory:  Negative for cough, choking, chest tightness, shortness of breath and wheezing.   Cardiovascular:  Negative for chest pain, palpitations and leg swelling.  Gastrointestinal:  Negative for abdominal distention, abdominal pain, constipation, diarrhea, nausea and vomiting.  Endocrine: Negative for polydipsia, polyphagia and polyuria.  Genitourinary:  Negative for dysuria, flank pain, hematuria and urgency.  Musculoskeletal:  Negative for back pain, gait problem, myalgias and neck pain.  Skin:  Negative for pallor, rash and wound.  Neurological:  Negative for seizures, syncope, weakness, numbness and headaches.  Psychiatric/Behavioral:  Negative for confusion and dysphoric mood.     Objective:       08/03/2022    8:23 AM 04/01/2022    8:11 AM 01/10/2022   11:06 AM  Vitals with BMI  Height '5\' 10"'$  '5\' 10"'$    Weight 187 lbs 182 lbs 13 oz   BMI 70.62 37.62   Systolic 831 517 616  Diastolic 76 74 78  Pulse 64 60 87    BP 124/76   Pulse 64   Ht '5\' 10"'$   (1.778 m)   Wt 187 lb (84.8 kg)   BMI 26.83 kg/m   Wt Readings from Last 3 Encounters:  08/03/22 187 lb (84.8 kg)  04/01/22 182 lb 12.8 oz (82.9 kg)  12/25/21 183 lb (83 kg)      CMP ( most recent) CMP     Component Value Date/Time   NA 143 03/24/2022 0836   K 4.3 03/24/2022 0836   CL 104 03/24/2022 0836   CO2 23 03/24/2022 0836   GLUCOSE 142 (H) 03/24/2022 0836   GLUCOSE 106 (H) 04/05/2014 0908   BUN 14 03/24/2022 0836   CREATININE 1.05 03/24/2022 0836   CALCIUM 9.9 03/24/2022 0836   PROT 6.3 03/24/2022 0836   ALBUMIN 4.4 03/24/2022 0836   AST 16 03/24/2022 0836   ALT 34 03/24/2022 0836   ALKPHOS 68 03/24/2022 0836   BILITOT 1.1 03/24/2022 0836   GFRNONAA >90 04/05/2014 0908   GFRAA >90 04/05/2014 0908     Diabetic Labs (most recent): Lab Results  Component Value Date   HGBA1C 7.5 (A) 08/03/2022   HGBA1C 7.2 (A) 04/01/2022   HGBA1C 7.3 (A) 11/26/2021   MICROALBUR 30 08/03/2022     Lipid Panel ( most recent) Lipid Panel     Component Value Date/Time   CHOL 122 03/24/2022 0836   TRIG 137 03/24/2022 0836   HDL 43 03/24/2022 0836   CHOLHDL 2.8 03/24/2022 0836   LDLCALC 55 03/24/2022 0836   LABVLDL 24 03/24/2022 0836      Assessment & Plan:   Uncontrolled type 2 diabetes  - Mario [name] has currently uncontrolled symptomatic type 2 DM since  71 years of age.   Emeterio presents with his CGM device showing above target glycemic profile.  His average blood glucose is 174 for the last 7 days.  Mainly due to the fact that he could not secure his Ozempic for the last 3 weeks.  His AGP report shows 58% time range, 38% level 1 hyperglycemia, 4% level 2 hyperglycemia.  There is no hypoglycemia.  His point-of-care A1c is 7.5% increasing from 7.2%.  He is an overall improvement from 10.8%.    Recent labs reviewed. - I had a long discussion with him about the progressive nature of diabetes and the pathology behind  its complications. -He does not report any gross  complications, however he remains at a high risk for more acute and chronic complications which include CAD, CVA, CKD, retinopathy, and neuropathy. These are all discussed in detail with him.  -  he is advised to stick to a routine mealtimes to eat 3 meals  a day and avoid unnecessary snacks ( to snack only to correct hypoglycemia).   - he acknowledges that there is a room for improvement in his food and drink choices. - Suggestion is made for him to avoid simple carbohydrates  from his diet including Cakes, Sweet Desserts, Ice Cream, Soda (diet and regular), Sweet Tea, Candies, Chips, Cookies, Store Bought Juices, Alcohol , Artificial Sweeteners,  Coffee Creamer, and "Sugar-free" Products, Lemonade. This will help patient to have more stable blood glucose profile and potentially avoid unintended weight gain.  The following Lifestyle Medicine recommendations according to [facility]) were discussed and and offered to patient and he  agrees to start the journey:  A. Whole Foods, Plant-Based Nutrition comprising of fruits and vegetables, plant-based proteins, whole-grain carbohydrates was discussed in detail with the patient.   A list for source of those nutrients were also provided to the patient.  Patient will use only water or unsweetened tea for hydration. B.  The need to stay away from risky substances including alcohol, smoking; obtaining 7 to 9 hours of restorative sleep, at least 150 minutes of moderate intensity exercise weekly, the importance of healthy social connections,  and stress management techniques were discussed. C.  A full color page of  Calorie density of various food groups per pound showing examples of each food groups was provided to the patient.     - he will be scheduled with Jearld Fenton, RDN, CDE for diabetes education.  - I have approached him with the following individualized plan to manage  his diabetes and patient agrees:   -He  continues to respond to lifestyle medicine achieving more control with less medications.  He is advised to continue to use his CGM continuously.  If he cannot secure Ozempic 2 mg, he is switched to Ozempic 1 mg subcutaneously weekly.  He is also advised to stay on metformin 1000 mg p.o. twice daily.   -He was tapered off of Jardiance due to side effects.  - he is encouraged to call clinic for blood glucose levels less than 70 or above 200 mg /dl at fasting. - Specific targets for  A1c;  LDL, HDL,  and Triglycerides were discussed with the patient.  2) Blood Pressure /Hypertension:   -His blood pressure is controlled to target.  he is advised to continue his current medications including hydrochlorothiazide 12.5 mg p.o. daily mg p.o. daily with breakfast .  He is also on Benicar.  Since he would benefit from Benicar, he will be advised to discontinue hydrochlorothiazide if he returns with marginal blood pressure.  3) Lipids/Hyperlipidemia:   Review of his recent lipid panel showed improved triglyceride level to 162 from 269.  And his LDL is 55, improving from 77.    He is advised to continue atorvastatin 40 mg p.o. nightly.   Side effects and precautions discussed with him.  He will be considered for fasting lipid panel before his next visit.   4)  Weight/Diet: He presents with fluctuating body weight recently.    Overall this patient lost close to 150 pounds over the years, progressively.  Body mass index is 26.83 kg/m.  He has achieved adequate weight loss already,    he is not a candidate for major weight loss.    I discussed with him the fact that loss of 5 - 10% of his  current body weight will have the most impact on his diabetes management.  Exercise, and detailed carbohydrates information provided  -  detailed on discharge instructions.  5) Chronic Care/Health Maintenance:  -he  is on ACEI/ARB and Statin medications and  is encouraged to initiate and continue to follow up with  Ophthalmology, Dentist,  Podiatrist at least yearly or according to recommendations, and advised to   stay away from smoking. I have recommended yearly flu vaccine and pneumonia vaccine at least every 5 years; moderate intensity exercise for up to 150 minutes weekly; and  sleep for at least 7 hours a day.  - he is  advised to maintain close follow up with [facility] family medicine for primary care needs,  as well as his other providers for optimal and coordinated care.   I spent 41 minutes in the care of the patient today including review of labs from [place], Lipids, Thyroid Function, Hematology (current and previous including abstractions from other facilities); face-to-face time discussing  his blood glucose readings/logs, discussing hypoglycemia and hyperglycemia episodes and symptoms, medications doses, his options of short and long term treatment based on the latest standards of care / guidelines;  discussion about incorporating lifestyle medicine;  and documenting the encounter. Risk reduction counseling performed per USPSTF guidelines to reduce cardiovascular risk factors.     Please refer to Patient Instructions for Blood Glucose Monitoring and Insulin/Medications Dosing Guide"  in media tab for additional information. Please  also refer to " Patient Self Inventory" in the Media  tab for reviewed elements of pertinent patient history.  Mario [name] participated in the discussions, expressed understanding, and voiced agreement with the above plans.  All questions were answered to his satisfaction. he is encouraged to contact clinic should he have any questions or concerns prior to his return visit.    Follow up plan: - Return in about 3 months (around 11/03/2022) for F/U with Pre-visit Labs, Meter/CGM/Logs, A1c here.  Glade Lloyd, MD [facility] [place] [facility] [address] [place], [place] [address] Phone: [phone]  Fax: [phone]    08/03/2022,  12:52 PM  This note was partially dictated with voice recognition software. Similar sounding words can be transcribed inadequately or may not  be corrected upon review.

## 2022-09-25 LAB — LIPID PANEL
Cholesterol: 124 (ref 0–200)
HDL: 52 (ref 35–70)
LDL Cholesterol: 50
Triglycerides: 123 (ref 40–160)

## 2022-09-25 LAB — BASIC METABOLIC PANEL
BUN: 12 (ref 4–21)
Creatinine: 0.9 (ref 0.6–1.3)
Sodium: 133 — AB (ref 137–147)

## 2022-09-25 LAB — TSH: TSH: 2.03 (ref 0.41–5.90)

## 2022-10-25 ENCOUNTER — Other Ambulatory Visit: Payer: Self-pay | Admitting: "Endocrinology

## 2022-10-25 DIAGNOSIS — E1165 Type 2 diabetes mellitus with hyperglycemia: Secondary | ICD-10-CM

## 2022-10-27 ENCOUNTER — Other Ambulatory Visit: Payer: Self-pay | Admitting: "Endocrinology

## 2022-10-27 DIAGNOSIS — E1165 Type 2 diabetes mellitus with hyperglycemia: Secondary | ICD-10-CM

## 2022-11-03 ENCOUNTER — Ambulatory Visit: Payer: HMO | Admitting: "Endocrinology

## 2022-11-03 ENCOUNTER — Encounter: Payer: Self-pay | Admitting: "Endocrinology

## 2022-11-03 VITALS — BP 134/82 | HR 68 | Ht 70.0 in | Wt 191.6 lb

## 2022-11-03 DIAGNOSIS — E782 Mixed hyperlipidemia: Secondary | ICD-10-CM

## 2022-11-03 DIAGNOSIS — E1165 Type 2 diabetes mellitus with hyperglycemia: Secondary | ICD-10-CM | POA: Diagnosis not present

## 2022-11-03 DIAGNOSIS — I10 Essential (primary) hypertension: Secondary | ICD-10-CM

## 2022-11-03 LAB — POCT GLYCOSYLATED HEMOGLOBIN (HGB A1C): HbA1c, POC (controlled diabetic range): 7.9 % — AB (ref 0.0–7.0)

## 2022-11-03 NOTE — Progress Notes (Signed)
11/03/2022, 12:56 PM  Endocrinology follow-up note   Subjective:    Patient ID: Mario [name], male    DOB: September 12, 1951.  Mario [name] is being seen in follow-up after he was seen in consultation for  management of currently uncontrolled symptomatic diabetes requested by  [place], [place] [facility] [place].   Past Medical History:  Diagnosis Date   Chronic back pain    radiculopatly  and stenosis   Complication of anesthesia    woke up being very agitated   Diabetes mellitus without complication ([place])    takes Metformin and Amaryl daily   History of gout 75yr ago   History of kidney stones 30+yrs ago   History of shingles 1990   Hyperlipidemia    takes Atorvastatin daily   Rosacea    uses Metronidazole daily    Past Surgical History:  Procedure Laterality Date   CERVICAL FUSION  1998   COLONOSCOPY     LUMBAR LAMINECTOMY/DECOMPRESSION MICRODISCECTOMY N/A 04/15/2014   Procedure: LUMBAR LAMINECTOMY/DECOMPRESSION MICRODISCECTOMY 2 LEVELS  lumbar three/four, four/five  ;  Surgeon: JOphelia Charter MD;  [facility];  Service: Neurosurgery;  Laterality: N/A;   TONSILLECTOMY  1958    Social History   Socioeconomic History   Marital status: Married    Spouse name: Not on file   Number of children: Not on file   Years of education: Not on file   Highest education level: Not on file  Occupational History   Not on file  Tobacco Use   Smoking status: Never   Smokeless tobacco: Not on file  Substance and Sexual Activity   Alcohol use: No   Drug use: No   Sexual activity: Yes  Other Topics Concern   Not on file  Social History Narrative   Not on file   Social Determinants of Health   Financial Resource Strain: Not on file  Food Insecurity: Not on file  Transportation Needs: Not on file  Physical Activity: Not on file  Stress: Not on file  Social Connections: Not on  file    Family History  Problem Relation Age of Onset   Hypertension Mother    Diabetes Mother    Hyperlipidemia Mother    Heart attack Mother    Heart failure Mother    Cancer Father    Heart attack Father    Hyperlipidemia Father    Hypertension Father     Outpatient Encounter Medications as of 11/03/2022  Medication Sig   aspirin EC 325 MG tablet Take 325 mg by mouth daily.   atorvastatin (LIPITOR) 40 MG tablet Take 40 mg by mouth at bedtime.   Continuous Blood Gluc Receiver (FREESTYLE LIBRE 2 READER) DEVI USE TO CHECK BLOOD SUGAR AS DIRECTED   Continuous Blood Gluc Sensor (FREESTYLE LIBRE 2 SENSOR) MISC USE TO CHECK BLOOD SUGAR. CHANGE EVERY 14 DAYS   hydrochlorothiazide (MICROZIDE) 12.5 MG capsule Take 12.5 mg by mouth every morning.   Ibuprofen-Diphenhydramine Cit (ADVIL PM PO) Take 2 tablets by mouth at bedtime.   metFORMIN (GLUCOPHAGE) 1000 MG tablet Take 1,000 mg by mouth 2 (two) times daily  with a meal.   metroNIDAZOLE (METROCREAM) 0.75 % cream Apply 1 application topically 2 (two) times daily. To face   olmesartan (BENICAR) 20 MG tablet Take 40 mg by mouth daily.   Omega-3 Fatty Acids (FISH OIL) 1200 MG CAPS Take 2,400 mg by mouth daily.   Semaglutide, 1 MG/DOSE, 4 MG/3ML SOPN Inject 1 mg as directed once a week. (Patient taking differently: Inject 2 mg as directed once a week.)   No facility-administered encounter medications on file as of 11/03/2022.    ALLERGIES: Allergies  Allergen Reactions   Adhesive [Tape]     Breaks out    VACCINATION STATUS: Immunization History  Administered Date(s) Administered   Moderna Covid-19 Vaccine Bivalent Booster 54yr & up 07/28/2021   Moderna Sars-Covid-2 Vaccination 12/07/2019, 01/07/2020    Diabetes He presents for his follow-up diabetic visit. He has type 2 diabetes mellitus. Onset time: He was diagnosed at approximate age of 89years. His disease course has been worsening. There are no hypoglycemic associated symptoms.  Pertinent negatives for hypoglycemia include no confusion, headaches, pallor or seizures. Pertinent negatives for diabetes include no chest pain, no fatigue, no polydipsia, no polyphagia, no polyuria and no weakness. There are no hypoglycemic complications. Symptoms are worsening. There are no diabetic complications. Risk factors for coronary artery disease include diabetes mellitus, dyslipidemia, hypertension, male sex and family history. Current diabetic treatments: He is currently on Ozempic 2 mg weeklyand metformin 1000 mg p.o. twice daily. His weight is fluctuating minimally (Patient gives history of weighing as much as 300 pounds in the past.  She was progressively losing weight until last visit.  He returns with a few pounds of weight gain this time.). He is following a generally unhealthy diet. When asked about meal planning, he reported none. His home blood glucose trend is increasing steadily. His breakfast blood glucose range is generally 180-200 mg/dl. His lunch blood glucose range is generally 180-200 mg/dl. His dinner blood glucose range is generally 180-200 mg/dl. His bedtime blood glucose range is generally 180-200 mg/dl. His overall blood glucose range is 180-200 mg/dl. (Nazier Neyhartpresents with his CGM device showing above target glycemic profile.  His average blood glucose is 198 for the last 14 days.  He has been without his Ozempic for at least 4 weeks.  He is CGM AGP shows 41% time range, 47% level 1 hyperglycemia, 12% level 2 hyperglycemia.  His point-of-care A1c is 7.9% increasing from 7.5% during his last visit.  However, his A1c is improving from 10.8% overall.  ) An ACE inhibitor/angiotensin II receptor blocker is being taken. Eye exam is current.  Hyperlipidemia This is a chronic problem. The problem is uncontrolled. Exacerbating diseases include diabetes. Pertinent negatives include no chest pain, myalgias or shortness of breath. Current antihyperlipidemic treatment includes statins. Risk  factors for coronary artery disease include diabetes mellitus, dyslipidemia, hypertension, male sex and a sedentary lifestyle.  Hypertension This is a chronic problem. The problem is controlled. Pertinent negatives include no chest pain, headaches, neck pain, palpitations or shortness of breath. Risk factors for coronary artery disease include dyslipidemia, diabetes mellitus, family history, male gender and sedentary lifestyle. Past treatments include diuretics and angiotensin blockers.     Review of Systems  Constitutional:  Negative for chills, fatigue, fever and unexpected weight change.  HENT:  Negative for dental problem, mouth sores and trouble swallowing.   Eyes:  Negative for visual disturbance.  Respiratory:  Negative for cough, choking, chest tightness, shortness of breath and wheezing.  Cardiovascular:  Negative for chest pain, palpitations and leg swelling.  Gastrointestinal:  Negative for abdominal distention, abdominal pain, constipation, diarrhea, nausea and vomiting.  Endocrine: Negative for polydipsia, polyphagia and polyuria.  Genitourinary:  Negative for dysuria, flank pain, hematuria and urgency.  Musculoskeletal:  Negative for back pain, gait problem, myalgias and neck pain.  Skin:  Negative for pallor, rash and wound.  Neurological:  Negative for seizures, syncope, weakness, numbness and headaches.  Psychiatric/Behavioral:  Negative for confusion and dysphoric mood.     Objective:       11/03/2022    8:47 AM 08/03/2022    8:23 AM 04/01/2022    8:11 AM  Vitals with BMI  Height '5\' 10"'$  '5\' 10"'$  '5\' 10"'$   Weight 191 lbs 10 oz 187 lbs 182 lbs 13 oz  BMI 27.49 02.54 27.06  Systolic 237 628 315  Diastolic 82 76 74  Pulse 68 64 60    BP 134/82   Pulse 68   Ht '5\' 10"'$  (1.778 m)   Wt 191 lb 9.6 oz (86.9 kg)   BMI 27.49 kg/m   Wt Readings from Last 3 Encounters:  11/03/22 191 lb 9.6 oz (86.9 kg)  08/03/22 187 lb (84.8 kg)  04/01/22 182 lb 12.8 oz (82.9 kg)       CMP ( most recent) CMP     Component Value Date/Time   NA 133 (A) 09/25/2022 0000   K 4.3 03/24/2022 0836   CL 104 03/24/2022 0836   CO2 23 03/24/2022 0836   GLUCOSE 142 (H) 03/24/2022 0836   GLUCOSE 106 (H) 04/05/2014 0908   BUN 12 09/25/2022 0000   CREATININE 0.9 09/25/2022 0000   CREATININE 1.05 03/24/2022 0836   CALCIUM 9.9 03/24/2022 0836   PROT 6.3 03/24/2022 0836   ALBUMIN 4.4 03/24/2022 0836   AST 16 03/24/2022 0836   ALT 34 03/24/2022 0836   ALKPHOS 68 03/24/2022 0836   BILITOT 1.1 03/24/2022 0836   GFRNONAA >90 04/05/2014 0908   GFRAA >90 04/05/2014 0908     Diabetic Labs (most recent): Lab Results  Component Value Date   HGBA1C 7.9 (A) 11/03/2022   HGBA1C 7.5 (A) 08/03/2022   HGBA1C 7.2 (A) 04/01/2022   MICROALBUR 30 08/03/2022     Lipid Panel ( most recent) Lipid Panel     Component Value Date/Time   CHOL 124 09/25/2022 0000   CHOL 122 03/24/2022 0836   TRIG 123 09/25/2022 0000   HDL 52 09/25/2022 0000   HDL 43 03/24/2022 0836   CHOLHDL 2.8 03/24/2022 0836   LDLCALC 50 09/25/2022 0000   LDLCALC 55 03/24/2022 0836   LABVLDL 24 03/24/2022 0836      Assessment & Plan:   Uncontrolled type 2 diabetes  - Mario [name] has currently uncontrolled symptomatic type 2 DM since  72 years of age.   [place] presents with his CGM device showing above target glycemic profile.  His average blood glucose is 198 for the last 14 days.  He has been without his Ozempic for at least 4 weeks.  He is CGM AGP shows 41% time range, 47% level 1 hyperglycemia, 12% level 2 hyperglycemia.  His point-of-care A1c is 7.9% increasing from 7.5% during his last visit.  However, his A1c is improving from 10.8% overall.   Recent labs reviewed. - I had a long discussion with him about the progressive nature of diabetes and the pathology behind its complications. -He does not report any gross complications, however he remains at  a high risk for more acute and chronic complications  which include CAD, CVA, CKD, retinopathy, and neuropathy. These are all discussed in detail with him.  -  he is advised to stick to a routine mealtimes to eat 3 meals  a day and avoid unnecessary snacks ( to snack only to correct hypoglycemia).   - he acknowledges that there is a room for improvement in his food and drink choices. - Suggestion is made for him to avoid simple carbohydrates  from his diet including Cakes, Sweet Desserts, Ice Cream, Soda (diet and regular), Sweet Tea, Candies, Chips, Cookies, Store Bought Juices, Alcohol , Artificial Sweeteners,  Coffee Creamer, and "Sugar-free" Products, Lemonade. This will help patient to have more stable blood glucose profile and potentially avoid unintended weight gain.  The following Lifestyle Medicine recommendations according to [facility]) were discussed and and offered to patient and he  agrees to start the journey:  A. Whole Foods, Plant-Based Nutrition comprising of fruits and vegetables, plant-based proteins, whole-grain carbohydrates was discussed in detail with the patient.   A list for source of those nutrients were also provided to the patient.  Patient will use only water or unsweetened tea for hydration. B.  The need to stay away from risky substances including alcohol, smoking; obtaining 7 to 9 hours of restorative sleep, at least 150 minutes of moderate intensity exercise weekly, the importance of healthy social connections,  and stress management techniques were discussed. C.  A full color page of  Calorie density of various food groups per pound showing examples of each food groups was provided to the patient.   - I have approached him with the following individualized plan to manage  his diabetes and patient agrees:   -He continues to respond to lifestyle medicine achieving more control with less medications.  Due to the fact that he did have labs and Ozempic, his A1c increased to 7.9%.  However,  he now has coverage for his Ozempic.  He is advised to continue Ozempic 2 mg subcutaneously weekly, continue metformin 1000 mg p.o. twice daily.  He is advised to return in 4 months with labs and A1c. If he loses control, he will be reconsidered for Jardiance at a low dose.   - he is encouraged to call clinic for blood glucose levels less than 70 or above 200 mg /dl at fasting. - Specific targets for  A1c;  LDL, HDL,  and Triglycerides were discussed with the patient.  2) Blood Pressure /Hypertension:   -His blood pressure is controlled to target.  he is advised to continue his current medications including hydrochlorothiazide 12.5 mg p.o. daily mg p.o. daily with breakfast .  He is also on Benicar.  Since he would benefit from Benicar, he will be advised to discontinue hydrochlorothiazide if he returns with marginal blood pressure.  3) Lipids/Hyperlipidemia:   Review of his recent lipid panel showed improved LDL to 50.  He is advised to continue atorvastatin 40 mg p.o. nightly.     Side effects and precautions discussed with him.  He will be considered for fasting lipid panel before his next visit.   4)  Weight/Diet: He presents with fluctuating body weight recently.    Overall this patient lost close to 150 pounds over the years, progressively.  Body mass index is 27.49 kg/m.  He has achieved adequate weight loss already,    he is not a candidate for major weight loss.  I discussed with him the fact that loss of 5 - 10% of his  current body weight will have the most impact on his diabetes management.  Exercise, and detailed carbohydrates information provided  -  detailed on discharge instructions.  5) Chronic Care/Health Maintenance:  -he  is on ACEI/ARB and Statin medications and  is encouraged to initiate and continue to follow up with Ophthalmology, Dentist,  Podiatrist at least yearly or according to recommendations, and advised to   stay away from smoking. I have recommended yearly flu  vaccine and pneumonia vaccine at least every 5 years; moderate intensity exercise for up to 150 minutes weekly; and  sleep for at least 7 hours a day.  - he is  advised to maintain close follow up with [facility] family medicine for primary care needs,  as well as his other providers for optimal and coordinated care.   I spent 28 minutes in the care of the patient today including review of labs from [place], Lipids, Thyroid Function, Hematology (current and previous including abstractions from other facilities); face-to-face time discussing  his blood glucose readings/logs, discussing hypoglycemia and hyperglycemia episodes and symptoms, medications doses, his options of short and long term treatment based on the latest standards of care / guidelines;  discussion about incorporating lifestyle medicine;  and documenting the encounter. Risk reduction counseling performed per USPSTF guidelines to reduce obesity and cardiovascular risk factors.     Please refer to Patient Instructions for Blood Glucose Monitoring and Insulin/Medications Dosing Guide"  in media tab for additional information. Please  also refer to " Patient Self Inventory" in the Media  tab for reviewed elements of pertinent patient history.  Mario [name] participated in the discussions, expressed understanding, and voiced agreement with the above plans.  All questions were answered to his satisfaction. he is encouraged to contact clinic should he have any questions or concerns prior to his return visit.   Follow up plan: - Return in about 4 months (around 03/04/2023) for Bring Meter/CGM Device/Logs- A1c in Office.  Glade Lloyd, MD [facility] [address] [place], [place] [address] Phone: [phone]  Fax: [phone]    11/03/2022, 12:56 PM  This note was partially dictated with voice recognition software. Similar sounding words can be transcribed inadequately or may not  be  corrected upon review.

## 2022-11-03 NOTE — Patient Instructions (Signed)
                                     Advice for Weight Management  -For most of us the best way to lose weight is by diet management. Generally speaking, diet management means consuming less calories intentionally which over time brings about progressive weight loss.  This can be achieved more effectively by avoiding ultra processed carbohydrates, processed meats, unhealthy fats.    It is critically important to know your numbers: how much calorie you are consuming and how much calorie you need. More importantly, our carbohydrates sources should be unprocessed naturally occurring  complex starch food items.  It is always important to balance nutrition also by  appropriate intake of proteins (mainly plant-based), healthy fats/oils, plenty of fruits and vegetables.   -The American College of Lifestyle Medicine (ACL M) recommends nutrition derived mostly from Whole Food, Plant Predominant Sources example an apple instead of applesauce or apple pie. Eat Plenty of vegetables, Mushrooms, fruits, Legumes, Whole Grains, Nuts, seeds in lieu of processed meats, processed snacks/pastries red meat, poultry, eggs.  Use only water or unsweetened tea for hydration.  The College also recommends the need to stay away from risky substances including alcohol, smoking; obtaining 7-9 hours of restorative sleep, at least 150 minutes of moderate intensity exercise weekly, importance of healthy social connections, and being mindful of stress and seek help when it is overwhelming.    -Sticking to a routine mealtime to eat 3 meals a day and avoiding unnecessary snacks is shown to have a big role in weight control. Under normal circumstances, the only time we burn stored energy is when we are hungry, so allow  some hunger to take place- hunger means no food between appropriate meal times, only water.  It is not advisable to starve.   -It is better to avoid simple carbohydrates including:  Cakes, Sweet Desserts, Ice Cream, Soda (diet and regular), Sweet Tea, Candies, Chips, Cookies, Store Bought Juices, Alcohol in Excess of  1-2 drinks a day, Lemonade,  Artificial Sweeteners, Doughnuts, Coffee Creamers, "Sugar-free" Products, etc, etc.  This is not a complete list.....    -Consulting with certified diabetes educators is proven to provide you with the most accurate and current information on diet.  Also, you may be  interested in discussing diet options/exchanges , we can schedule a visit with Mario [name], RDN, CDE for individualized nutrition education.  -Exercise: If you are able: 30 -60 minutes a day ,4 days a week, or 150 minutes of moderate intensity exercise weekly.    The longer the better if tolerated.  Combine stretch, strength, and aerobic activities.  If you were told in the past that you have high risk for cardiovascular diseases, or if you are currently symptomatic, you may seek evaluation by your heart doctor prior to initiating moderate to intense exercise programs.                                  Additional Care Considerations for Diabetes/Prediabetes   -Diabetes  is a chronic disease.  The most important care consideration is regular follow-up with your diabetes care provider with the goal being avoiding or delaying its complications and to take advantage of advances in medications and technology.  If appropriate actions are taken early enough, type 2 diabetes can even be   reversed.  Seek information from the right source.  - Whole Food, Plant Predominant Nutrition is highly recommended: Eat Plenty of vegetables, Mushrooms, fruits, Legumes, Whole Grains, Nuts, seeds in lieu of processed meats, processed snacks/pastries red meat, poultry, eggs as recommended by American College of  Lifestyle Medicine (ACLM).  -Type 2 diabetes is known to coexist with other important comorbidities such as high blood pressure and high cholesterol.  It is critical to control not only the  diabetes but also the high blood pressure and high cholesterol to minimize and delay the risk of complications including coronary artery disease, stroke, amputations, blindness, etc.  The good news is that this diet recommendation for type 2 diabetes is also very helpful for managing high cholesterol and high blood blood pressure.  - Studies showed that people with diabetes will benefit from a class of medications known as ACE inhibitors and statins.  Unless there are specific reasons not to be on these medications, the standard of care is to consider getting one from these groups of medications at an optimal doses.  These medications are generally considered safe and proven to help protect the heart and the kidneys.    - People with diabetes are encouraged to initiate and maintain regular follow-up with eye doctors, foot doctors, dentists , and if necessary heart and kidney doctors.     - It is highly recommended that people with diabetes quit smoking or stay away from smoking, and get yearly  flu vaccine and pneumonia vaccine at least every 5 years.  See above for additional recommendations on exercise, sleep, stress management , and healthy social connections.      

## 2022-11-04 ENCOUNTER — Other Ambulatory Visit: Payer: Self-pay | Admitting: "Endocrinology

## 2022-11-04 DIAGNOSIS — E1165 Type 2 diabetes mellitus with hyperglycemia: Secondary | ICD-10-CM

## 2022-12-22 DIAGNOSIS — M5441 Lumbago with sciatica, right side: Secondary | ICD-10-CM | POA: Diagnosis not present

## 2022-12-22 DIAGNOSIS — M9902 Segmental and somatic dysfunction of thoracic region: Secondary | ICD-10-CM | POA: Diagnosis not present

## 2022-12-22 DIAGNOSIS — M9905 Segmental and somatic dysfunction of pelvic region: Secondary | ICD-10-CM | POA: Diagnosis not present

## 2022-12-22 DIAGNOSIS — M9903 Segmental and somatic dysfunction of lumbar region: Secondary | ICD-10-CM | POA: Diagnosis not present

## 2022-12-23 ENCOUNTER — Encounter: Payer: Self-pay | Admitting: Radiology

## 2023-01-20 ENCOUNTER — Other Ambulatory Visit: Payer: Self-pay | Admitting: "Endocrinology

## 2023-01-20 DIAGNOSIS — E1165 Type 2 diabetes mellitus with hyperglycemia: Secondary | ICD-10-CM

## 2023-03-02 DIAGNOSIS — M5441 Lumbago with sciatica, right side: Secondary | ICD-10-CM | POA: Diagnosis not present

## 2023-03-02 DIAGNOSIS — M9903 Segmental and somatic dysfunction of lumbar region: Secondary | ICD-10-CM | POA: Diagnosis not present

## 2023-03-02 DIAGNOSIS — M9905 Segmental and somatic dysfunction of pelvic region: Secondary | ICD-10-CM | POA: Diagnosis not present

## 2023-03-02 DIAGNOSIS — M9902 Segmental and somatic dysfunction of thoracic region: Secondary | ICD-10-CM | POA: Diagnosis not present

## 2023-03-04 ENCOUNTER — Ambulatory Visit (INDEPENDENT_AMBULATORY_CARE_PROVIDER_SITE_OTHER): Payer: PPO | Admitting: "Endocrinology

## 2023-03-04 ENCOUNTER — Encounter: Payer: Self-pay | Admitting: "Endocrinology

## 2023-03-04 VITALS — BP 130/84 | HR 68 | Ht 70.0 in | Wt 188.8 lb

## 2023-03-04 DIAGNOSIS — Z7985 Long-term (current) use of injectable non-insulin antidiabetic drugs: Secondary | ICD-10-CM

## 2023-03-04 DIAGNOSIS — E782 Mixed hyperlipidemia: Secondary | ICD-10-CM | POA: Diagnosis not present

## 2023-03-04 DIAGNOSIS — I10 Essential (primary) hypertension: Secondary | ICD-10-CM

## 2023-03-04 DIAGNOSIS — E1165 Type 2 diabetes mellitus with hyperglycemia: Secondary | ICD-10-CM | POA: Diagnosis not present

## 2023-03-04 DIAGNOSIS — Z7984 Long term (current) use of oral hypoglycemic drugs: Secondary | ICD-10-CM | POA: Diagnosis not present

## 2023-03-04 LAB — POCT GLYCOSYLATED HEMOGLOBIN (HGB A1C): HbA1c, POC (controlled diabetic range): 7.6 % — AB (ref 0.0–7.0)

## 2023-03-04 MED ORDER — FREESTYLE LIBRE 2 SENSOR MISC: 2 refills | Status: DC

## 2023-03-04 MED ORDER — METFORMIN HCL 1000 MG PO TABS: 1000.0000 mg | ORAL_TABLET | Freq: Two times a day (BID) | ORAL | 1 refills | Status: AC

## 2023-03-04 NOTE — Patient Instructions (Signed)

## 2023-03-04 NOTE — Progress Notes (Signed)
03/04/2023, 8:40 AM  Endocrinology follow-up note   Subjective:    Patient ID: Mario [name], male    DOB: [birth year].  Mario [name] is being seen in follow-up after he was seen in consultation for  management of currently uncontrolled symptomatic diabetes requested by  Mario Joe, MD.   Past Medical History:  Diagnosis Date   Chronic back pain    radiculopatly  and stenosis   Complication of anesthesia    woke up being very agitated   Diabetes mellitus without complication (HCC)    takes Metformin and Amaryl daily   History of gout 57yrs ago   History of kidney stones 30+yrs ago   History of shingles 1990   Hyperlipidemia    takes Atorvastatin daily   Rosacea    uses Metronidazole daily    Past Surgical History:  Procedure Laterality Date   CERVICAL FUSION  1998   COLONOSCOPY     LUMBAR LAMINECTOMY/DECOMPRESSION MICRODISCECTOMY N/A 04/15/2014   Procedure: LUMBAR LAMINECTOMY/DECOMPRESSION MICRODISCECTOMY 2 LEVELS  lumbar three/four, four/five  ;  Surgeon: Cristi Loron, MD;  Location: [facility];  Service: Neurosurgery;  Laterality: N/A;   TONSILLECTOMY  1958    Social History   Socioeconomic History   Marital status: Married    Spouse name: Not on file   Number of children: Not on file   Years of education: Not on file   Highest education level: Not on file  Occupational History   Not on file  Tobacco Use   Smoking status: Never   Smokeless tobacco: Not on file  Substance and Sexual Activity   Alcohol use: No   Drug use: No   Sexual activity: Yes  Other Topics Concern   Not on file  Social History Narrative   Not on file   Social Determinants of Health   Financial Resource Strain: Not on file  Food Insecurity: Not on file  Transportation Needs: Not on file  Physical Activity: Not on file  Stress: Not on file  Social Connections: Not on file    Family History   Problem Relation Age of Onset   Hypertension Mother    Diabetes Mother    Hyperlipidemia Mother    Heart attack Mother    Heart failure Mother    Cancer Father    Heart attack Father    Hyperlipidemia Father    Hypertension Father     Outpatient Encounter Medications as of 03/04/2023  Medication Sig   aspirin EC 325 MG tablet Take 325 mg by mouth daily.   atorvastatin (LIPITOR) 40 MG tablet Take 40 mg by mouth at bedtime.   Continuous Blood Gluc Receiver (FREESTYLE LIBRE 2 READER) DEVI USE TO CHECK BLOOD SUGAR AS DIRECTED   Continuous Glucose Sensor (FREESTYLE LIBRE 2 SENSOR) MISC USE TO CHECK BLOOD GLUCOSE FOR 14 DAYS, THEN REPLACE   hydrochlorothiazide (MICROZIDE) 12.5 MG capsule Take 12.5 mg by mouth every morning.   Ibuprofen-Diphenhydramine Cit (ADVIL PM PO) Take 2 tablets by mouth at bedtime.   metFORMIN (GLUCOPHAGE) 1000 MG tablet Take 1 tablet (1,000 mg total) by mouth 2 (two) times daily  with a meal.   metroNIDAZOLE (METROCREAM) 0.75 % cream Apply 1 application topically 2 (two) times daily. To face   olmesartan (BENICAR) 20 MG tablet Take 40 mg by mouth daily.   Omega-3 Fatty Acids (FISH OIL) 1200 MG CAPS Take 2,400 mg by mouth daily.   OZEMPIC, 2 MG/DOSE, 8 MG/3ML SOPN Inject 2 mg into the skin once weekly as directed   Semaglutide, 1 MG/DOSE, 4 MG/3ML SOPN Inject 1 mg as directed once a week. (Patient taking differently: Inject 2 mg as directed once a week.)   [DISCONTINUED] Continuous Blood Gluc Sensor (FREESTYLE LIBRE 2 SENSOR) MISC USE TO CHECK BLOOD GLUCOSE FOR 14 DAYS, THEN REPLACE   [DISCONTINUED] metFORMIN (GLUCOPHAGE) 1000 MG tablet Take 1,000 mg by mouth 2 (two) times daily with a meal.   No facility-administered encounter medications on file as of 03/04/2023.    ALLERGIES: Allergies  Allergen Reactions   Adhesive [Tape]     Breaks out    VACCINATION STATUS: Immunization History  Administered Date(s) Administered   Moderna Covid-19 Vaccine Bivalent  Booster 68yrs & up 07/28/2021   Moderna Sars-Covid-2 Vaccination 12/07/2019, 01/07/2020    Diabetes He presents for his follow-up diabetic visit. He has type 2 diabetes mellitus. Onset time: He was diagnosed at approximate age of 41 years. His disease course has been improving. There are no hypoglycemic associated symptoms. Pertinent negatives for hypoglycemia include no confusion, headaches, pallor or seizures. Pertinent negatives for diabetes include no chest pain, no fatigue, no polydipsia, no polyphagia, no polyuria and no weakness. There are no hypoglycemic complications. Symptoms are improving. There are no diabetic complications. Risk factors for coronary artery disease include diabetes mellitus, dyslipidemia, hypertension, male sex and family history. Current diabetic treatments: He is currently on Ozempic 2 mg weeklyand metformin 1000 mg p.o. twice daily. His weight is decreasing steadily (Patient gives history of weighing as much as 300 pounds in the past.  He continues to lose weight progressively.). He is following a generally unhealthy diet. When asked about meal planning, he reported none. His home blood glucose trend is decreasing steadily. His breakfast blood glucose range is generally 180-200 mg/dl. His lunch blood glucose range is generally 180-200 mg/dl. His dinner blood glucose range is generally 180-200 mg/dl. His bedtime blood glucose range is generally 180-200 mg/dl. His overall blood glucose range is 180-200 mg/dl. Mario [name] presents with his CGM device showing above target glycemic profile.  His average blood glucose is 196 for the last 14 days.  He is currently on Ozempic and metformin.  His point-of-care A1c is 7.6% improving from 7.9%.  His AGP shows 41% time in range, 47% level 1 hyperglycemia, 4% level 2 hyperglycemia.  He does not have hypoglycemia.  His A1c has improved from 10.8% from a year ago.    ) An ACE inhibitor/angiotensin II receptor blocker is being taken. Eye exam is  current.  Hyperlipidemia This is a chronic problem. The problem is uncontrolled. Exacerbating diseases include diabetes. Pertinent negatives include no chest pain, myalgias or shortness of breath. Current antihyperlipidemic treatment includes statins. Risk factors for coronary artery disease include diabetes mellitus, dyslipidemia, hypertension, male sex and a sedentary lifestyle.  Hypertension This is a chronic problem. The problem is controlled. Pertinent negatives include no chest pain, headaches, neck pain, palpitations or shortness of breath. Risk factors for coronary artery disease include dyslipidemia, diabetes mellitus, family history, male gender and sedentary lifestyle. Past treatments include diuretics and angiotensin blockers.     Review of Systems  Constitutional:  Negative for chills, fatigue, fever and unexpected weight change.  HENT:  Negative for dental problem, mouth sores and trouble swallowing.   Eyes:  Negative for visual disturbance.  Respiratory:  Negative for cough, choking, chest tightness, shortness of breath and wheezing.   Cardiovascular:  Negative for chest pain, palpitations and leg swelling.  Gastrointestinal:  Negative for abdominal distention, abdominal pain, constipation, diarrhea, nausea and vomiting.  Endocrine: Negative for polydipsia, polyphagia and polyuria.  Genitourinary:  Negative for dysuria, flank pain, hematuria and urgency.  Musculoskeletal:  Negative for back pain, gait problem, myalgias and neck pain.  Skin:  Negative for pallor, rash and wound.  Neurological:  Negative for seizures, syncope, weakness, numbness and headaches.  Psychiatric/Behavioral:  Negative for confusion and dysphoric mood.     Objective:       03/04/2023    8:22 AM 11/03/2022    8:47 AM 08/03/2022    8:23 AM  Vitals with BMI  Height 5\' 10"  5\' 10"  5\' 10"   Weight 188 lbs 13 oz 191 lbs 10 oz 187 lbs  BMI 27.09 27.49 26.83  Systolic 130 134 657  Diastolic 84 82 76   Pulse 68 68 64    BP 130/84   Pulse 68   Ht 5\' 10"  (1.778 m)   Wt 188 lb 12.8 oz (85.6 kg)   BMI 27.09 kg/m   Wt Readings from Last 3 Encounters:  03/04/23 188 lb 12.8 oz (85.6 kg)  11/03/22 191 lb 9.6 oz (86.9 kg)  08/03/22 187 lb (84.8 kg)      CMP ( most recent) CMP     Component Value Date/Time   NA 133 (A) 09/25/2022 0000   K 4.3 03/24/2022 0836   CL 104 03/24/2022 0836   CO2 23 03/24/2022 0836   GLUCOSE 142 (H) 03/24/2022 0836   GLUCOSE 106 (H) 04/05/2014 0908   BUN 12 09/25/2022 0000   CREATININE 0.9 09/25/2022 0000   CREATININE 1.05 03/24/2022 0836   CALCIUM 9.9 03/24/2022 0836   PROT 6.3 03/24/2022 0836   ALBUMIN 4.4 03/24/2022 0836   AST 16 03/24/2022 0836   ALT 34 03/24/2022 0836   ALKPHOS 68 03/24/2022 0836   BILITOT 1.1 03/24/2022 0836   GFRNONAA >90 04/05/2014 0908   GFRAA >90 04/05/2014 0908     Diabetic Labs (most recent): Lab Results  Component Value Date   HGBA1C 7.6 (A) 03/04/2023   HGBA1C 7.9 (A) 11/03/2022   HGBA1C 7.5 (A) 08/03/2022   MICROALBUR 30 08/03/2022     Lipid Panel ( most recent) Lipid Panel     Component Value Date/Time   CHOL 124 09/25/2022 0000   CHOL 122 03/24/2022 0836   TRIG 123 09/25/2022 0000   HDL 52 09/25/2022 0000   HDL 43 03/24/2022 0836   CHOLHDL 2.8 03/24/2022 0836   LDLCALC 50 09/25/2022 0000   LDLCALC 55 03/24/2022 0836   LABVLDL 24 03/24/2022 0836      Assessment & Plan:   Uncontrolled type 2 diabetes  - Mario [name] has currently uncontrolled symptomatic type 2 DM since  72 years of age.   Jamane presents with his CGM device showing above target glycemic profile.  His average blood glucose is 196 for the last 14 days.  He is currently on Ozempic and metformin.  His point-of-care A1c is 7.6% improving from 7.9%.  His AGP shows 41% time in range, 47% level 1 hyperglycemia, 4% level 2 hyperglycemia.  He does not have hypoglycemia.  His A1c has improved from 10.8% from a year ago.    Recent labs  reviewed. - I had a long discussion with him about the progressive nature of diabetes and the pathology behind its complications. -He does not report any gross complications, however he remains at a high risk for more acute and chronic complications which include CAD, CVA, CKD, retinopathy, and neuropathy. These are all discussed in detail with him.  -  he is advised to stick to a routine mealtimes to eat 3 meals  a day and avoid unnecessary snacks ( to snack only to correct hypoglycemia).   - he acknowledges that there is a room for improvement in his food and drink choices. - Suggestion is made for him to avoid simple carbohydrates  from his diet including Cakes, Sweet Desserts, Ice Cream, Soda (diet and regular), Sweet Tea, Candies, Chips, Cookies, Store Bought Juices, Alcohol , Artificial Sweeteners,  Coffee Creamer, and "Sugar-free" Products, Lemonade. This will help patient to have more stable blood glucose profile and potentially avoid unintended weight gain.  The following Lifestyle Medicine recommendations according to American College of Lifestyle Medicine  [facility]) were discussed and and offered to patient and he  agrees to start the journey:  A. Whole Foods, Plant-Based Nutrition comprising of fruits and vegetables, plant-based proteins, whole-grain carbohydrates was discussed in detail with the patient.   A list for source of those nutrients were also provided to the patient.  Patient will use only water or unsweetened tea for hydration. B.  The need to stay away from risky substances including alcohol, smoking; obtaining 7 to 9 hours of restorative sleep, at least 150 minutes of moderate intensity exercise weekly, the importance of healthy social connections,  and stress management techniques were discussed. C.  A full color page of  Calorie density of various food groups per pound showing examples of each food groups was provided to the patient.   - I have approached him with the  following individualized plan to manage  his diabetes and patient agrees:   -He continues to respond to lifestyle medicine achieving control of his diabetes as well as hyperlipidemia.  He is achieving good results with less medications.   He will benefit from staying on his current regimen of Ozempic 2 mg subcutaneously weekly, and metformin 1000 mg p.o. twice daily.  Side effects and precautions discussed with him.   If he loses control, he will be reconsidered for Jardiance at a low dose.   - he is encouraged to call clinic for blood glucose levels less than 70 or above 200 mg /dl at fasting. - Specific targets for  A1c;  LDL, HDL,  and Triglycerides were discussed with the patient.  2) Blood Pressure /Hypertension:   -His blood pressure is controlled to target.  he is advised to continue his current medications including hydrochlorothiazide 12.5 mg p.o. daily mg p.o. daily with breakfast .  He is also on Benicar.  Since he would benefit from Benicar, he will be advised to discontinue hydrochlorothiazide if he returns with marginal blood pressure.  3) Lipids/Hyperlipidemia:   Review of his recent lipid panel showed improved LDL to 50.  He is advised to continue atorvastatin 40 mg p.o. nightly.     Side effects and precautions discussed with him.  He will be considered for fasting lipid panel before his next visit.   4)  Weight/Diet: His BMI is 27.09-not a candidate for major weight loss.    He has achieved  adequate weight loss already,    he is not a candidate for major weight loss.  He has achieved weight loss of more than 150 pounds over the years.  I discussed with him the fact that loss of 5 - 10% of his  current body weight will have the most impact on his diabetes management.  Exercise, and detailed carbohydrates information provided  -  detailed on discharge instructions.  5) Chronic Care/Health Maintenance:  -he  is on ACEI/ARB and Statin medications and  is encouraged to initiate and  continue to follow up with Ophthalmology, Dentist,  Podiatrist at least yearly or according to recommendations, and advised to   stay away from smoking. I have recommended yearly flu vaccine and pneumonia vaccine at least every 5 years; moderate intensity exercise for up to 150 minutes weekly; and  sleep for at least 7 hours a day.  Diabetes foot exam is normal on Mar 12, 2023  - he is  advised to maintain close follow up with [facility] family medicine for primary care needs,  as well as his other providers for optimal and coordinated care.    I spent  26  minutes in the care of the patient today including review of labs from CMP, Lipids, Thyroid Function, Hematology (current and previous including abstractions from other facilities); face-to-face time discussing  his blood glucose readings/logs, discussing hypoglycemia and hyperglycemia episodes and symptoms, medications doses, his options of short and long term treatment based on the latest standards of care / guidelines;  discussion about incorporating lifestyle medicine;  and documenting the encounter. Risk reduction counseling performed per USPSTF guidelines to reduce cardiovascular risk factors.     Please refer to Patient Instructions for Blood Glucose Monitoring and Insulin/Medications Dosing Guide"  in media tab for additional information. Please  also refer to " Patient Self Inventory" in the Media  tab for reviewed elements of pertinent patient history.  Mario [name] participated in the discussions, expressed understanding, and voiced agreement with the above plans.  All questions were answered to his satisfaction. he is encouraged to contact clinic should he have any questions or concerns prior to his return visit.    Follow up plan: - Return in about 3 months (around 06/04/2023) for F/U with Pre-visit Labs, Meter/CGM/Logs, A1c here.  Marquis Lunch, MD [facility] [address] [place], [place] [address] Phone: [phone]  Fax: [phone]    03/04/2023, 8:40 AM  This note was partially dictated with voice recognition software. Similar sounding words can be transcribed inadequately or may not  be corrected upon review.

## 2023-04-20 ENCOUNTER — Other Ambulatory Visit: Payer: Self-pay | Admitting: "Endocrinology

## 2023-04-20 DIAGNOSIS — E1165 Type 2 diabetes mellitus with hyperglycemia: Secondary | ICD-10-CM

## 2023-04-25 DIAGNOSIS — D2261 Melanocytic nevi of right upper limb, including shoulder: Secondary | ICD-10-CM | POA: Diagnosis not present

## 2023-04-25 DIAGNOSIS — D2272 Melanocytic nevi of left lower limb, including hip: Secondary | ICD-10-CM | POA: Diagnosis not present

## 2023-04-25 DIAGNOSIS — C44321 Squamous cell carcinoma of skin of nose: Secondary | ICD-10-CM | POA: Diagnosis not present

## 2023-04-25 DIAGNOSIS — D1801 Hemangioma of skin and subcutaneous tissue: Secondary | ICD-10-CM | POA: Diagnosis not present

## 2023-04-25 DIAGNOSIS — Z85828 Personal history of other malignant neoplasm of skin: Secondary | ICD-10-CM | POA: Diagnosis not present

## 2023-04-25 DIAGNOSIS — D225 Melanocytic nevi of trunk: Secondary | ICD-10-CM | POA: Diagnosis not present

## 2023-04-25 DIAGNOSIS — L720 Epidermal cyst: Secondary | ICD-10-CM | POA: Diagnosis not present

## 2023-04-25 DIAGNOSIS — D485 Neoplasm of uncertain behavior of skin: Secondary | ICD-10-CM | POA: Diagnosis not present

## 2023-04-25 DIAGNOSIS — L570 Actinic keratosis: Secondary | ICD-10-CM | POA: Diagnosis not present

## 2023-04-25 DIAGNOSIS — D692 Other nonthrombocytopenic purpura: Secondary | ICD-10-CM | POA: Diagnosis not present

## 2023-04-25 DIAGNOSIS — D2271 Melanocytic nevi of right lower limb, including hip: Secondary | ICD-10-CM | POA: Diagnosis not present

## 2023-04-25 DIAGNOSIS — D2262 Melanocytic nevi of left upper limb, including shoulder: Secondary | ICD-10-CM | POA: Diagnosis not present

## 2023-05-02 IMAGING — MR MR LUMBAR SPINE WO/W CM
4 of 7 series · 24 of 48 positions shown · IV contrast (multihance)
Comparison: 02/18/2014

CLINICAL DATA: Lumbar radiculopathy. Chronic low back and bilateral
leg pain. Prior lumbar surgery.

EXAM:
MRI LUMBAR SPINE WITHOUT AND WITH CONTRAST
TECHNIQUE: Multiplanar and multiecho pulse sequences of the lumbar spine were
obtained without and with intravenous contrast.
CONTRAST:  20mL MULTIHANCE GADOBENATE DIMEGLUMINE 529 MG/ML IV SOLN

[Series 3: T2 · sagittal · 4.0mm · 0.57mm/px · 4 of 18 slices shown (1 of 2)]
[im 1/18]
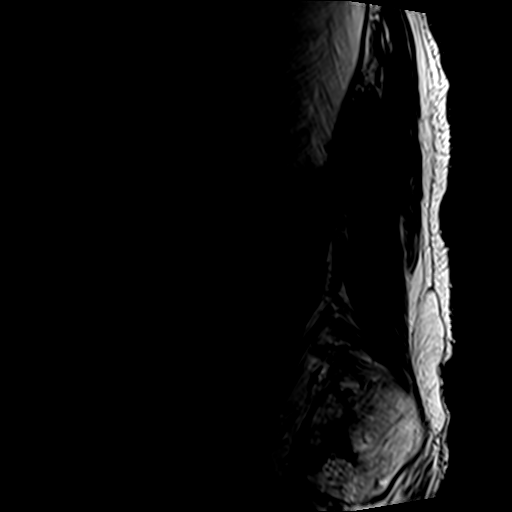
[im 6/18]
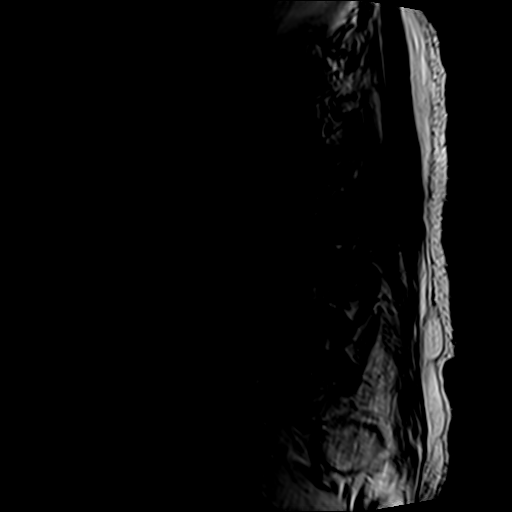
[im 12/18]
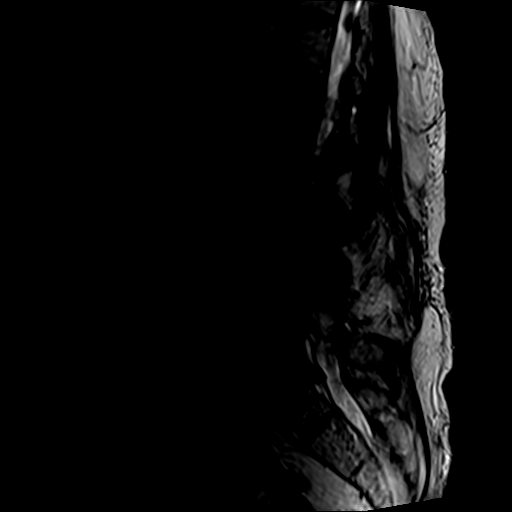
[im 18/18]
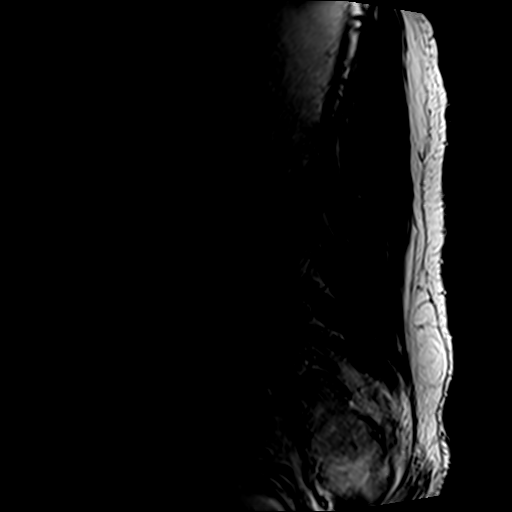

[Series 5: T1 · sagittal · 4.0mm · 0.57mm/px · 5 of 18 slices shown (1 of 2)]
[im 1/18]
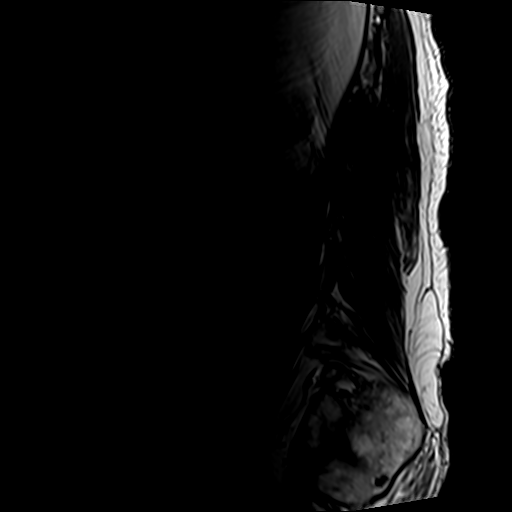
[im 5/18]
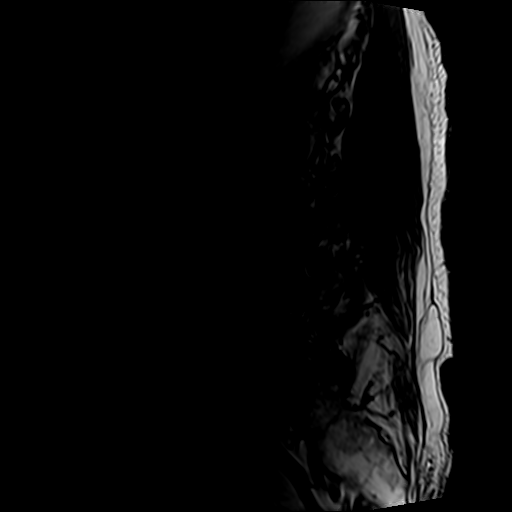
[im 9/18]
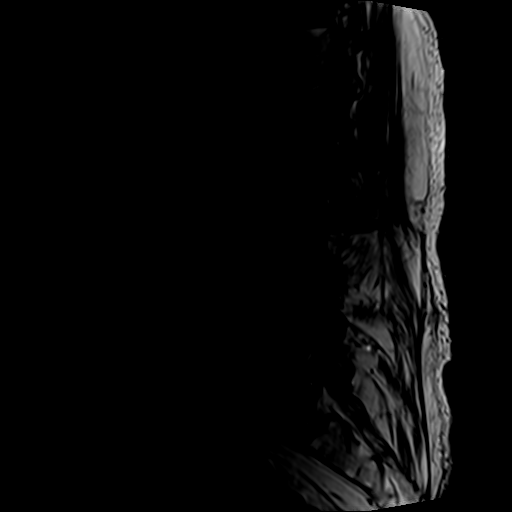
[im 13/18]
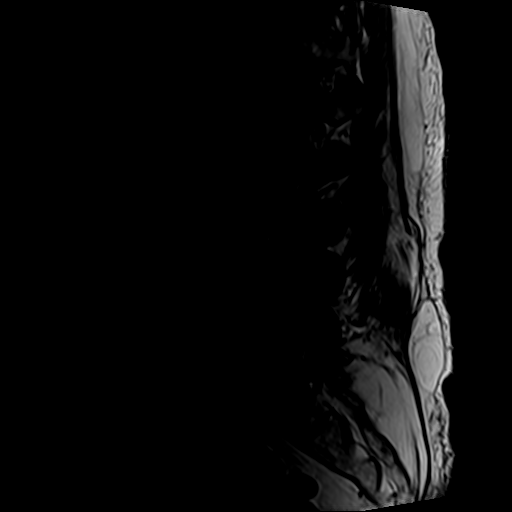
[im 18/18]
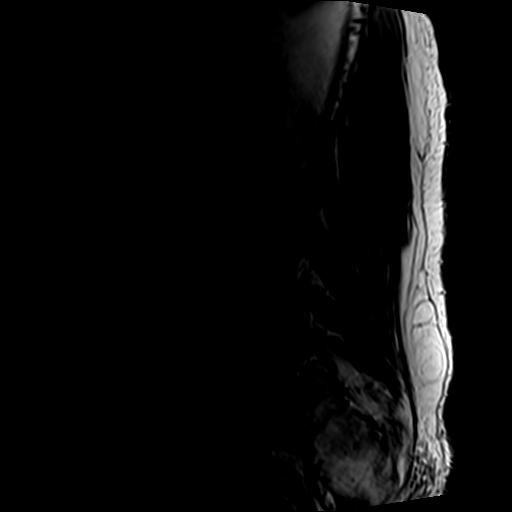

[Series 6: T2 · axial · 4.0mm · 0.70mm/px · z∈[-123,+103]mm · 8 of 39 slices shown (2 of 2)]
[im 1/39]
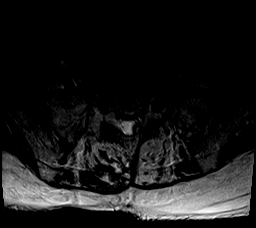
[im 5/39]
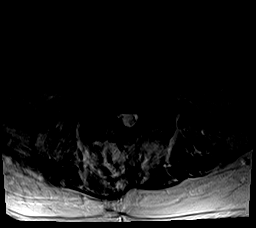
[im 13/39]
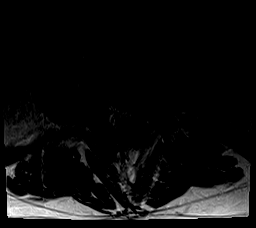
[im 17/39]
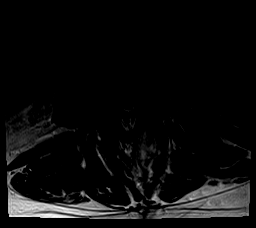
[im 22/39]
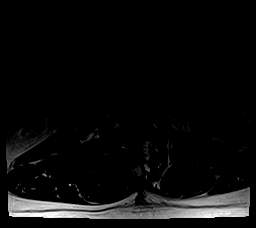
[im 26/39]
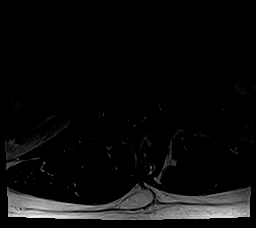
[im 34/39]
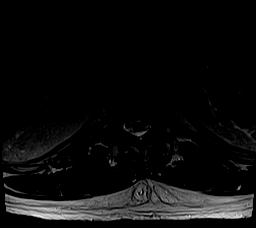
[im 39/39]
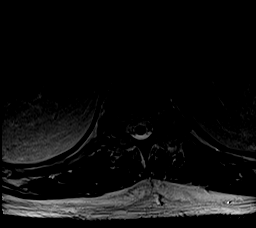

[Series 7: T1 · axial · 4.0mm · 0.35mm/px · z∈[-123,+80]mm · 7 of 40 slices shown (2 of 2)]
[im 1/40]
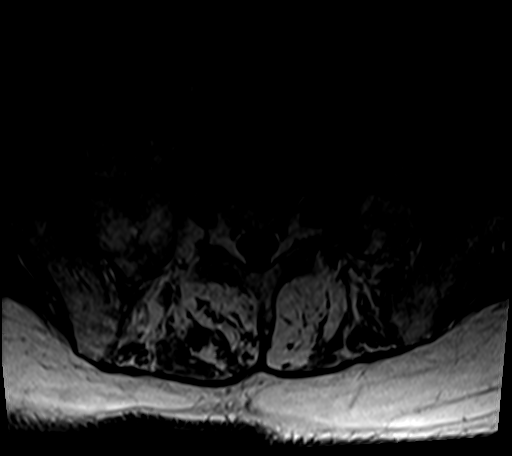
[im 5/40]
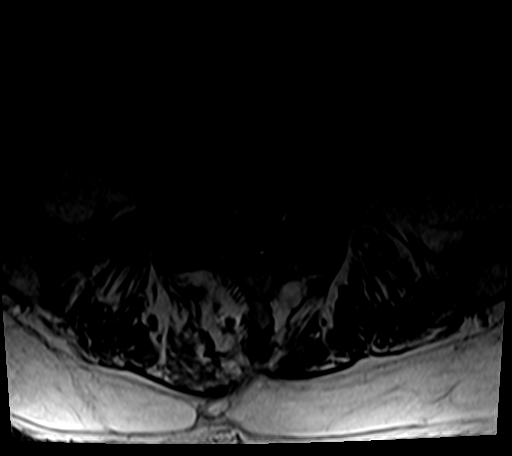
[im 14/40]
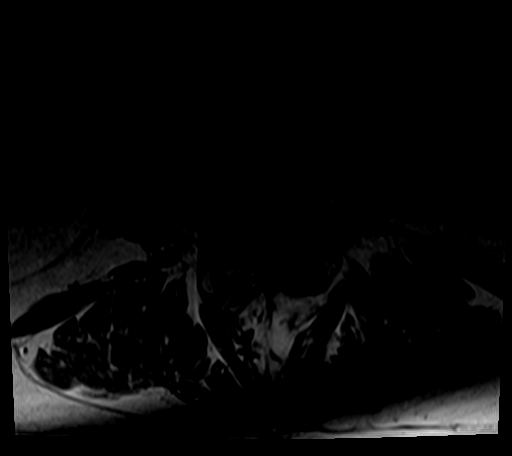
[im 18/40]
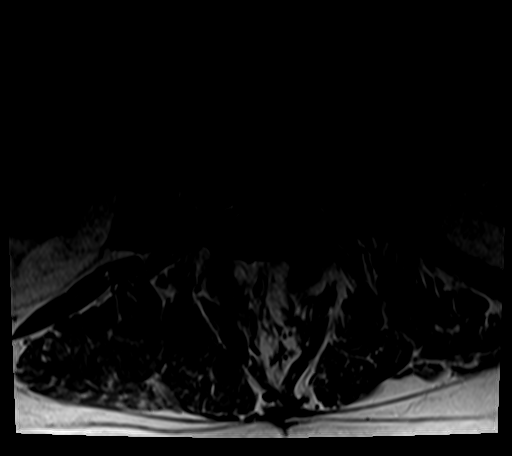
[im 22/40]
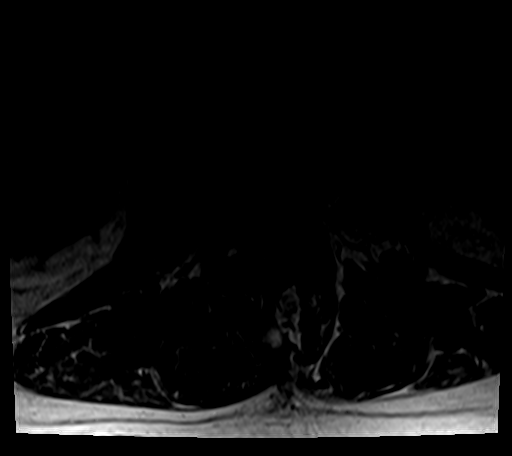
[im 27/40]
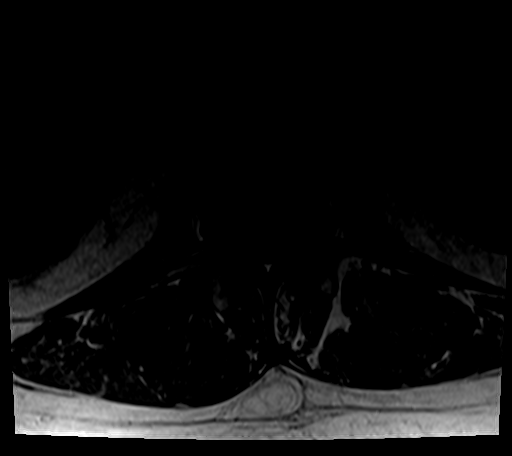
[im 35/40]
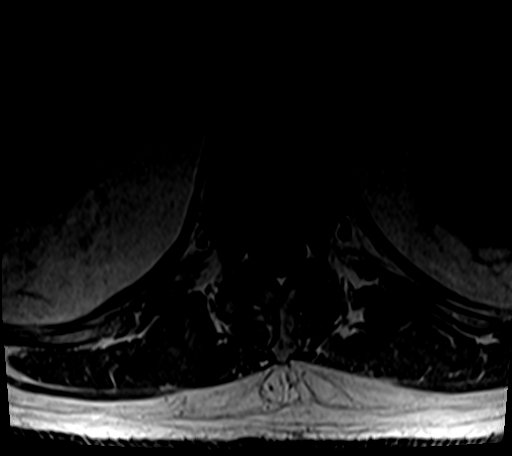

[24 of 48 positions shown; findings below may reference images not displayed]

FINDINGS: The study is mildly motion degraded.

Segmentation: Standard.

Alignment: Mild-to-moderate lumbar dextroscoliosis. Unchanged
minimal retrolisthesis of L2 on L3 and L5 on S1. New minimal
anterolisthesis of L3 on L4 and minimal retrolisthesis of L4 on L5.

Vertebrae: No fracture, suspicious osseous lesion, or evidence of
discitis. Primarily chronic degenerative endplate changes at L3-4
and L5-S1, progressed from the prior study at both levels.

Conus medullaris and cauda equina: Conus extends to the L2 level.
Conus and cauda equina appear normal.

Paraspinal and other soft tissues: Postoperative changes in the
posterior lumbar soft tissues. No fluid collection. Bilateral renal
sinus cysts.

Disc levels:

Disc desiccation from L2-3 to L5-S1. Progressive severe disc space
narrowing at L5-S1 and moderate asymmetric left-sided disc space
narrowing at L3-4. Unchanged mild disc space narrowing at L4-5.

T12-L1: Mild facet hypertrophy without disc herniation or stenosis.

L1-2: Mild facet hypertrophy without disc herniation or stenosis.

L2-3: Disc bulging and moderate facet and ligamentum flavum
hypertrophy result in mild-to-moderate bilateral lateral recess
stenosis and mild bilateral neural foraminal stenosis without
significant spinal stenosis, unchanged.

L3-4: Interval laminectomies. Disc bulging, asymmetric left-sided
disc space height loss, endplate spurring, and moderate to severe
facet hypertrophy result in mild residual bilateral lateral recess
stenosis and mild right and moderate to severe left neural foraminal
stenosis. Neural foraminal stenosis has progressed. No significant
residual spinal stenosis.

L4-5: Interval laminectomies. Disc bulging, endplate spurring, and
moderate facet hypertrophy result in moderate right greater than
left neural foraminal stenosis, stable to mildly progressed. There
is mild residual bilateral lateral recess stenosis without residual
spinal stenosis.

L5-S1: The central disc extrusion on the prior study has regressed.
Disc bulging, a broad residual posterior disc protrusion, endplate
spurring, severe disc space height loss, and moderate facet
hypertrophy result in mild-to-moderate bilateral lateral recess
stenosis and severe right and mild left neural foraminal stenosis
without significant generalized spinal stenosis.
IMPRESSION: 1. Interval laminectomies at L3-4 and L4-5 without residual spinal
stenosis.
2. Progressive, moderate to severe left neural foraminal stenosis at
L3-4 and stable to slightly increased moderate neural foraminal
stenosis at L4-5.
3. Regression of central disc extrusion at L5-S1 but with overall
progression of chronically advanced disc degeneration at this level.
Mild-to-moderate lateral recess and severe right neural foraminal
stenosis.

## 2023-05-06 ENCOUNTER — Other Ambulatory Visit: Payer: Self-pay | Admitting: "Endocrinology

## 2023-05-06 DIAGNOSIS — E1165 Type 2 diabetes mellitus with hyperglycemia: Secondary | ICD-10-CM

## 2023-05-17 DIAGNOSIS — Z85828 Personal history of other malignant neoplasm of skin: Secondary | ICD-10-CM | POA: Diagnosis not present

## 2023-05-17 DIAGNOSIS — C44321 Squamous cell carcinoma of skin of nose: Secondary | ICD-10-CM | POA: Diagnosis not present

## 2023-06-15 ENCOUNTER — Ambulatory Visit: Payer: PPO | Admitting: "Endocrinology

## 2023-07-05 DIAGNOSIS — E1165 Type 2 diabetes mellitus with hyperglycemia: Secondary | ICD-10-CM | POA: Diagnosis not present

## 2023-07-12 ENCOUNTER — Encounter: Payer: Self-pay | Admitting: "Endocrinology

## 2023-07-12 ENCOUNTER — Ambulatory Visit (INDEPENDENT_AMBULATORY_CARE_PROVIDER_SITE_OTHER): Payer: PPO | Admitting: "Endocrinology

## 2023-07-12 VITALS — BP 126/78 | HR 60 | Ht 70.0 in | Wt 188.8 lb

## 2023-07-12 DIAGNOSIS — E782 Mixed hyperlipidemia: Secondary | ICD-10-CM | POA: Diagnosis not present

## 2023-07-12 DIAGNOSIS — I10 Essential (primary) hypertension: Secondary | ICD-10-CM

## 2023-07-12 DIAGNOSIS — E1165 Type 2 diabetes mellitus with hyperglycemia: Secondary | ICD-10-CM

## 2023-07-12 DIAGNOSIS — Z7984 Long term (current) use of oral hypoglycemic drugs: Secondary | ICD-10-CM | POA: Insufficient documentation

## 2023-07-12 LAB — POCT GLYCOSYLATED HEMOGLOBIN (HGB A1C): HbA1c, POC (controlled diabetic range): 7.6 % — AB (ref 0.0–7.0)

## 2023-07-12 MED ORDER — EMPAGLIFLOZIN 10 MG PO TABS: 10.0000 mg | ORAL_TABLET | Freq: Every day | ORAL | 1 refills | Status: DC

## 2023-07-12 NOTE — Patient Instructions (Signed)

## 2023-07-12 NOTE — Progress Notes (Signed)
07/12/2023, 11:24 AM  Endocrinology follow-up note   Subjective:    Patient ID: Mario [name], male    DOB: 29-Dec-1950.  Mario [name] is being seen in follow-up after he was seen in consultation for  management of currently uncontrolled symptomatic diabetes requested by  Mario Joe, MD.   Past Medical History:  Diagnosis Date   Chronic back pain    radiculopatly  and stenosis   Complication of anesthesia    woke up being very agitated   Diabetes mellitus without complication (HCC)    takes Metformin and Amaryl daily   History of gout 56yrs ago   History of kidney stones 30+yrs ago   History of shingles 1990   Hyperlipidemia    takes Atorvastatin daily   Rosacea    uses Metronidazole daily    Past Surgical History:  Procedure Laterality Date   CERVICAL FUSION  1998   COLONOSCOPY     LUMBAR LAMINECTOMY/DECOMPRESSION MICRODISCECTOMY N/A 04/15/2014   Procedure: LUMBAR LAMINECTOMY/DECOMPRESSION MICRODISCECTOMY 2 LEVELS  lumbar three/four, four/five  ;  Surgeon: Mario Loron, MD;  Location: [facility];  Service: Neurosurgery;  Laterality: N/A;   TONSILLECTOMY  1958    Social History   Socioeconomic History   Marital status: Married    Spouse name: Not on file   Number of children: Not on file   Years of education: Not on file   Highest education level: Not on file  Occupational History   Not on file  Tobacco Use   Smoking status: Never   Smokeless tobacco: Not on file  Substance and Sexual Activity   Alcohol use: No   Drug use: No   Sexual activity: Yes  Other Topics Concern   Not on file  Social History Narrative   Not on file   Social Determinants of Health   Financial Resource Strain: Not on file  Food Insecurity: Not on file  Transportation Needs: Not on file  Physical Activity: Not on file  Stress: Not on file  Social Connections: Not on file    Family  History  Problem Relation Age of Onset   Hypertension Mother    Diabetes Mother    Hyperlipidemia Mother    Heart attack Mother    Heart failure Mother    Cancer Father    Heart attack Father    Hyperlipidemia Father    Hypertension Father     Outpatient Encounter Medications as of 07/12/2023  Medication Sig   empagliflozin (JARDIANCE) 10 MG TABS tablet Take 1 tablet (10 mg total) by mouth daily before breakfast.   aspirin EC 325 MG tablet Take 325 mg by mouth daily.   atorvastatin (LIPITOR) 40 MG tablet Take 40 mg by mouth at bedtime.   Continuous Blood Gluc Receiver (FREESTYLE LIBRE 2 READER) DEVI USE TO CHECK BLOOD SUGAR AS DIRECTED   Continuous Glucose Sensor (FREESTYLE LIBRE 2 SENSOR) MISC USE TO CHECK BLOOD GLUCOSE FOR 14 DAYS, THEN REPLACE   hydrochlorothiazide (MICROZIDE) 12.5 MG capsule Take 12.5 mg by mouth every morning.   Ibuprofen-Diphenhydramine Cit (ADVIL PM PO) Take 2 tablets by mouth at bedtime.  metFORMIN (GLUCOPHAGE) 1000 MG tablet Take 1 tablet (1,000 mg total) by mouth 2 (two) times daily with a meal.   metroNIDAZOLE (METROCREAM) 0.75 % cream Apply 1 application topically 2 (two) times daily. To face   olmesartan (BENICAR) 20 MG tablet Take 40 mg by mouth daily.   Omega-3 Fatty Acids (FISH OIL) 1200 MG CAPS Take 2,400 mg by mouth daily.   OZEMPIC, 2 MG/DOSE, 8 MG/3ML SOPN Inject 2 mg into the skin once weekly as directed   Semaglutide, 1 MG/DOSE, 4 MG/3ML SOPN Inject 1 mg as directed once a week. (Patient taking differently: Inject 2 mg as directed once a week.)   No facility-administered encounter medications on file as of 07/12/2023.    ALLERGIES: Allergies  Allergen Reactions   Adhesive [Tape]     Breaks out    VACCINATION STATUS: Immunization History  Administered Date(s) Administered   Moderna Covid-19 Vaccine Bivalent Booster 67yrs & up 07/28/2021   Moderna Sars-Covid-2 Vaccination 12/07/2019, 01/07/2020    Diabetes He presents for his  follow-up diabetic visit. He has type 2 diabetes mellitus. Onset time: He was diagnosed at approximate age of 41 years. His disease course has been stable. There are no hypoglycemic associated symptoms. Pertinent negatives for hypoglycemia include no confusion, headaches, pallor or seizures. Pertinent negatives for diabetes include no chest pain, no fatigue, no polydipsia, no polyphagia, no polyuria and no weakness. There are no hypoglycemic complications. Symptoms are improving. There are no diabetic complications. Risk factors for coronary artery disease include diabetes mellitus, dyslipidemia, hypertension, male sex and family history. Current diabetic treatments: He is currently on Ozempic 2 mg weeklyand metformin 1000 mg p.o. twice daily. His weight is decreasing steadily (Patient gives history of weighing as much as 300 pounds in the past.  He continues to lose weight progressively.). He is following a generally unhealthy diet. When asked about meal planning, he reported none. His home blood glucose trend is fluctuating minimally. His breakfast blood glucose range is generally 180-200 mg/dl. His lunch blood glucose range is generally 180-200 mg/dl. His dinner blood glucose range is generally 180-200 mg/dl. His bedtime blood glucose range is generally 180-200 mg/dl. His overall blood glucose range is 180-200 mg/dl. Mario [name] presents with his CGM device showing still above target glycemic profile averaging 191 over the last 2 weeks.  His AGP report shows 45% time range, 46% daily 1 hyperglycemia, 9% level 2 hyperglycemia.   He did not document significant hypoglycemia.  He is point-of-care A1c remains unchanged at 7.6% although overall improved from 10.8%.   ) An ACE inhibitor/angiotensin II receptor blocker is being taken. Eye exam is current.  Hyperlipidemia This is a chronic problem. The problem is uncontrolled. Exacerbating diseases include diabetes. Pertinent negatives include no chest pain, myalgias or  shortness of breath. Current antihyperlipidemic treatment includes statins. Risk factors for coronary artery disease include diabetes mellitus, dyslipidemia, hypertension, male sex and a sedentary lifestyle.  Hypertension This is a chronic problem. The problem is controlled. Pertinent negatives include no chest pain, headaches, neck pain, palpitations or shortness of breath. Risk factors for coronary artery disease include dyslipidemia, diabetes mellitus, family history, male gender and sedentary lifestyle. Past treatments include diuretics and angiotensin blockers.     Review of Systems  Constitutional:  Negative for chills, fatigue, fever and unexpected weight change.  HENT:  Negative for dental problem, mouth sores and trouble swallowing.   Eyes:  Negative for visual disturbance.  Respiratory:  Negative for cough, choking, chest tightness, shortness of  breath and wheezing.   Cardiovascular:  Negative for chest pain, palpitations and leg swelling.  Gastrointestinal:  Negative for abdominal distention, abdominal pain, constipation, diarrhea, nausea and vomiting.  Endocrine: Negative for polydipsia, polyphagia and polyuria.  Genitourinary:  Negative for dysuria, flank pain, hematuria and urgency.  Musculoskeletal:  Negative for back pain, gait problem, myalgias and neck pain.  Skin:  Negative for pallor, rash and wound.  Neurological:  Negative for seizures, syncope, weakness, numbness and headaches.  Psychiatric/Behavioral:  Negative for confusion and dysphoric mood.     Objective:       07/12/2023    9:51 AM 03/04/2023    8:22 AM 11/03/2022    8:47 AM  Vitals with BMI  Height 5\' 10"  5\' 10"  5\' 10"   Weight 188 lbs 13 oz 188 lbs 13 oz 191 lbs 10 oz  BMI 27.09 27.09 27.49  Systolic 126 130 956  Diastolic 78 84 82  Pulse 60 68 68    BP 126/78   Pulse 60   Ht 5\' 10"  (1.778 m)   Wt 188 lb 12.8 oz (85.6 kg)   BMI 27.09 kg/m   Wt Readings from Last 3 Encounters:  07/12/23 188 lb  12.8 oz (85.6 kg)  03/04/23 188 lb 12.8 oz (85.6 kg)  11/03/22 191 lb 9.6 oz (86.9 kg)      CMP ( most recent) CMP     Component Value Date/Time   NA 139 07/05/2023 0802   K 4.3 07/05/2023 0802   CL 105 07/05/2023 0802   CO2 22 07/05/2023 0802   GLUCOSE 173 (H) 07/05/2023 0802   GLUCOSE 106 (H) 04/05/2014 0908   BUN 13 07/05/2023 0802   CREATININE 0.86 07/05/2023 0802   CALCIUM 9.5 07/05/2023 0802   PROT 6.0 07/05/2023 0802   ALBUMIN 4.2 07/05/2023 0802   AST 14 07/05/2023 0802   ALT 25 07/05/2023 0802   ALKPHOS 63 07/05/2023 0802   BILITOT 0.9 07/05/2023 0802   GFRNONAA >90 04/05/2014 0908   GFRAA >90 04/05/2014 0908     Diabetic Labs (most recent): Lab Results  Component Value Date   HGBA1C 7.6 (A) 07/12/2023   HGBA1C 7.6 (A) 03/04/2023   HGBA1C 7.9 (A) 11/03/2022   MICROALBUR 30 08/03/2022     Lipid Panel ( most recent) Lipid Panel     Component Value Date/Time   CHOL 116 07/05/2023 0802   TRIG 129 07/05/2023 0802   HDL 44 07/05/2023 0802   CHOLHDL 2.6 07/05/2023 0802   LDLCALC 49 07/05/2023 0802   LABVLDL 23 07/05/2023 0802      Assessment & Plan:   Uncontrolled type 2 diabetes  - Mario [name] has currently uncontrolled symptomatic type 2 DM since  72 years of age.   Resean presents with his CGM device showing still above target glycemic profile averaging 191 over the last 2 weeks.  His AGP report shows 45% time range, 46% daily 1 hyperglycemia, 9% level 2 hyperglycemia.   He did not document significant hypoglycemia.  He is point-of-care A1c remains unchanged at 7.6% although overall improved from 10.8%.  Recent labs reviewed. - I had a long discussion with him about the progressive nature of diabetes and the pathology behind its complications. -He does not report any gross complications, however he remains at a high risk for more acute and chronic complications which include CAD, CVA, CKD, retinopathy, and neuropathy. These are all discussed in  detail with him.  -  he is advised to stick  to a routine mealtimes to eat 3 meals  a day and avoid unnecessary snacks ( to snack only to correct hypoglycemia).   - he acknowledges that there is a room for improvement in his food and drink choices. - Suggestion is made for him to avoid simple carbohydrates  from his diet including Cakes, Sweet Desserts, Ice Cream, Soda (diet and regular), Sweet Tea, Candies, Chips, Cookies, Store Bought Juices, Alcohol , Artificial Sweeteners,  Coffee Creamer, and "Sugar-free" Products, Lemonade. This will help patient to have more stable blood glucose profile and potentially avoid unintended weight gain.  The following Lifestyle Medicine recommendations according to American College of Lifestyle Medicine  [facility]) were discussed and and offered to patient and he  agrees to start the journey:  A. Whole Foods, Plant-Based Nutrition comprising of fruits and vegetables, plant-based proteins, whole-grain carbohydrates was discussed in detail with the patient.   A list for source of those nutrients were also provided to the patient.  Patient will use only water or unsweetened tea for hydration. B.  The need to stay away from risky substances including alcohol, smoking; obtaining 7 to 9 hours of restorative sleep, at least 150 minutes of moderate intensity exercise weekly, the importance of healthy social connections,  and stress management techniques were discussed. C.  A full color page of  Calorie density of various food groups per pound showing examples of each food groups was provided to the patient.    - I have approached him with the following individualized plan to manage  his diabetes and patient agrees:   -He continues to respond to lifestyle medicine achieving control of his diabetes as well as hyperlipidemia.  He is achieving good results with less medications.   -He is advised to continue Ozempic at 2 mg subcutaneously weekly, metformin 1000 mg p.o. twice  daily.  I discussed and Jardiance 10 mg p.o. daily at breakfast. Side effects and precautions discussed with him.  - he is encouraged to call clinic for blood glucose levels less than 70 or above 200 mg /dl at fasting. - Specific targets for  A1c;  LDL, HDL,  and Triglycerides were discussed with the patient.  2) Blood Pressure /Hypertension:   -His blood pressure is controlled to target.  he is advised to continue his current medications including hydrochlorothiazide 12.5 mg p.o. daily mg p.o. daily with breakfast, Benicar 40 mg p.o. daily at breakfast.  3) Lipids/Hyperlipidemia:   Review of his recent lipid panel showed in improved lipid panel with LDL of 49.  He is advised to continue atorvastatin 40 mg p.o. nightly.       Side effects and precautions discussed with him.  He will be considered for fasting lipid panel before his next visit.   4)  Weight/Diet: His BMI is 27.09--not a candidate for major weight loss.    He has achieved adequate weight loss already,    he is not a candidate for major weight loss.  He has achieved weight loss of more than 150 pounds over the years.  I discussed with him the fact that loss of 5 - 10% of his  current body weight will have the most impact on his diabetes management.  Exercise, and detailed carbohydrates information provided  -  detailed on discharge instructions.  5) Chronic Care/Health Maintenance:  -he  is on ACEI/ARB and Statin medications and  is encouraged to initiate and continue to follow up with Ophthalmology, Dentist,  Podiatrist at least yearly or according  to recommendations, and advised to   stay away from smoking. I have recommended yearly flu vaccine and pneumonia vaccine at least every 5 years; moderate intensity exercise for up to 150 minutes weekly; and  sleep for at least 7 hours a day.  Diabetes foot exam is normal on Mar 12, 2023  - he is  advised to maintain close follow up with [facility] family medicine for primary care needs,   as well as his other providers for optimal and coordinated care.   I spent  26  minutes in the care of the patient today including review of labs from CMP, Lipids, Thyroid Function, Hematology (current and previous including abstractions from other facilities); face-to-face time discussing  his blood glucose readings/logs, discussing hypoglycemia and hyperglycemia episodes and symptoms, medications doses, his options of short and long term treatment based on the latest standards of care / guidelines;  discussion about incorporating lifestyle medicine;  and documenting the encounter. Risk reduction counseling performed per USPSTF guidelines to reduce  obesity and cardiovascular risk factors.     Please refer to Patient Instructions for Blood Glucose Monitoring and Insulin/Medications Dosing Guide"  in media tab for additional information. Please  also refer to " Patient Self Inventory" in the Media  tab for reviewed elements of pertinent patient history.  Mario [name] participated in the discussions, expressed understanding, and voiced agreement with the above plans.  All questions were answered to his satisfaction. he is encouraged to contact clinic should he have any questions or concerns prior to his return visit.   Follow up plan: - Return in about 4 months (around 11/11/2023) for Bring Meter/CGM Device/Logs- A1c in Office.  Marquis Lunch, MD [facility] [address] [place], [place] [address] Phone: [phone]  Fax: [phone]    07/12/2023, 11:24 AM  This note was partially dictated with voice recognition software. Similar sounding words can be transcribed inadequately or may not  be corrected upon review.

## 2023-09-21 ENCOUNTER — Other Ambulatory Visit: Payer: Self-pay

## 2023-09-21 DIAGNOSIS — E1165 Type 2 diabetes mellitus with hyperglycemia: Secondary | ICD-10-CM

## 2023-09-21 MED ORDER — OZEMPIC (2 MG/DOSE) 8 MG/3ML ~~LOC~~ SOPN
2.0000 mg | PEN_INJECTOR | SUBCUTANEOUS | 1 refills | Status: DC
Start: 2023-09-21 — End: 2024-01-17

## 2023-10-05 ENCOUNTER — Other Ambulatory Visit: Payer: Self-pay | Admitting: "Endocrinology

## 2023-10-05 DIAGNOSIS — E1165 Type 2 diabetes mellitus with hyperglycemia: Secondary | ICD-10-CM

## 2023-10-06 DIAGNOSIS — E7800 Pure hypercholesterolemia, unspecified: Secondary | ICD-10-CM | POA: Diagnosis not present

## 2023-10-06 LAB — LAB REPORT - SCANNED
EGFR: 85
TSH: 1.97 (ref 0.41–5.90)

## 2023-10-11 ENCOUNTER — Telehealth: Payer: Self-pay | Admitting: "Endocrinology

## 2023-10-11 ENCOUNTER — Other Ambulatory Visit: Payer: Self-pay | Admitting: *Deleted

## 2023-10-11 DIAGNOSIS — E7800 Pure hypercholesterolemia, unspecified: Secondary | ICD-10-CM | POA: Diagnosis not present

## 2023-10-11 DIAGNOSIS — F439 Reaction to severe stress, unspecified: Secondary | ICD-10-CM | POA: Diagnosis not present

## 2023-10-11 DIAGNOSIS — L719 Rosacea, unspecified: Secondary | ICD-10-CM | POA: Diagnosis not present

## 2023-10-11 DIAGNOSIS — E1165 Type 2 diabetes mellitus with hyperglycemia: Secondary | ICD-10-CM

## 2023-10-11 DIAGNOSIS — M51369 Other intervertebral disc degeneration, lumbar region without mention of lumbar back pain or lower extremity pain: Secondary | ICD-10-CM | POA: Diagnosis not present

## 2023-10-11 DIAGNOSIS — Z85828 Personal history of other malignant neoplasm of skin: Secondary | ICD-10-CM | POA: Diagnosis not present

## 2023-10-11 DIAGNOSIS — Z1331 Encounter for screening for depression: Secondary | ICD-10-CM | POA: Diagnosis not present

## 2023-10-11 DIAGNOSIS — D696 Thrombocytopenia, unspecified: Secondary | ICD-10-CM | POA: Diagnosis not present

## 2023-10-11 DIAGNOSIS — Z0000 Encounter for general adult medical examination without abnormal findings: Secondary | ICD-10-CM | POA: Diagnosis not present

## 2023-10-11 DIAGNOSIS — Z125 Encounter for screening for malignant neoplasm of prostate: Secondary | ICD-10-CM | POA: Diagnosis not present

## 2023-10-11 DIAGNOSIS — I10 Essential (primary) hypertension: Secondary | ICD-10-CM | POA: Diagnosis not present

## 2023-10-11 MED ORDER — FREESTYLE LIBRE 2 SENSOR MISC
3 refills | Status: DC
Start: 2023-10-11 — End: 2024-02-07

## 2023-10-11 NOTE — Telephone Encounter (Signed)
A prescription was sent in for the patients pharmacy.

## 2023-10-11 NOTE — Telephone Encounter (Signed)
Pt states that [facility] on [address] is needing a new Prescription for the [place] 2 plus sensors

## 2023-10-31 ENCOUNTER — Telehealth: Payer: Self-pay | Admitting: Orthopedic Surgery

## 2023-10-31 NOTE — Telephone Encounter (Signed)
 This patient had an appointment scheduled for LBP with Dr. Dallas Schimke that he scheduled himself online.  Spoke w/the pt, he has had back surgery before.  He will get all records and CD to [place] for review first if he still wants to be seen here.

## 2023-11-08 ENCOUNTER — Ambulatory Visit: Payer: HMO | Admitting: Orthopedic Surgery

## 2023-11-11 ENCOUNTER — Encounter: Payer: Self-pay | Admitting: "Endocrinology

## 2023-11-11 ENCOUNTER — Ambulatory Visit (INDEPENDENT_AMBULATORY_CARE_PROVIDER_SITE_OTHER): Payer: PPO | Admitting: "Endocrinology

## 2023-11-11 VITALS — BP 134/86 | HR 56 | Ht 70.0 in | Wt 182.6 lb

## 2023-11-11 DIAGNOSIS — E782 Mixed hyperlipidemia: Secondary | ICD-10-CM

## 2023-11-11 DIAGNOSIS — Z7984 Long term (current) use of oral hypoglycemic drugs: Secondary | ICD-10-CM | POA: Diagnosis not present

## 2023-11-11 DIAGNOSIS — E1165 Type 2 diabetes mellitus with hyperglycemia: Secondary | ICD-10-CM

## 2023-11-11 DIAGNOSIS — I10 Essential (primary) hypertension: Secondary | ICD-10-CM

## 2023-11-11 LAB — POCT GLYCOSYLATED HEMOGLOBIN (HGB A1C): HbA1c, POC (controlled diabetic range): 7.4 % — AB (ref 0.0–7.0)

## 2023-11-11 MED ORDER — EMPAGLIFLOZIN 25 MG PO TABS: 25.0000 mg | ORAL_TABLET | Freq: Every day | ORAL | 1 refills | Status: DC

## 2023-11-11 NOTE — Patient Instructions (Signed)

## 2023-11-11 NOTE — Progress Notes (Signed)
11/11/2023, 8:38 AM  Endocrinology follow-up note   Subjective:    Patient ID: Mario [name], male    DOB: 1951/08/25.  Mario [name] is being seen in follow-up after he was seen in consultation for  management of currently uncontrolled symptomatic diabetes requested by  Tally Joe, MD.   Past Medical History:  Diagnosis Date   Chronic back pain    radiculopatly  and stenosis   Complication of anesthesia    woke up being very agitated   Diabetes mellitus without complication (HCC)    takes Metformin and Amaryl daily   History of gout 4yrs ago   History of kidney stones 30+yrs ago   History of shingles 1990   Hyperlipidemia    takes Atorvastatin daily   Rosacea    uses Metronidazole daily    Past Surgical History:  Procedure Laterality Date   CERVICAL FUSION  1998   COLONOSCOPY     LUMBAR LAMINECTOMY/DECOMPRESSION MICRODISCECTOMY N/A 04/15/2014   Procedure: LUMBAR LAMINECTOMY/DECOMPRESSION MICRODISCECTOMY 2 LEVELS  lumbar three/four, four/five  ;  Surgeon: Cristi Loron, MD;  Location: [facility];  Service: Neurosurgery;  Laterality: N/A;   TONSILLECTOMY  1958    Social History   Socioeconomic History   Marital status: Married    Spouse name: Not on file   Number of children: Not on file   Years of education: Not on file   Highest education level: Not on file  Occupational History   Not on file  Tobacco Use   Smoking status: Never   Smokeless tobacco: Not on file  Substance and Sexual Activity   Alcohol use: No   Drug use: No   Sexual activity: Yes  Other Topics Concern   Not on file  Social History Narrative   Not on file   Social Drivers of Health   Financial Resource Strain: Not on file  Food Insecurity: Not on file  Transportation Needs: Not on file  Physical Activity: Not on file  Stress: Not on file  Social Connections: Not on file    Family History   Problem Relation Age of Onset   Hypertension Mother    Diabetes Mother    Hyperlipidemia Mother    Heart attack Mother    Heart failure Mother    Cancer Father    Heart attack Father    Hyperlipidemia Father    Hypertension Father     Outpatient Encounter Medications as of 11/11/2023  Medication Sig   aspirin EC 325 MG tablet Take 325 mg by mouth daily.   atorvastatin (LIPITOR) 40 MG tablet Take 40 mg by mouth at bedtime.   Continuous Blood Gluc Receiver (FREESTYLE LIBRE 2 READER) DEVI USE TO CHECK BLOOD SUGAR AS DIRECTED   Continuous Glucose Sensor (FREESTYLE LIBRE 2 SENSOR) MISC USE TO MONITOR BLOOD SUGAR CONTINUOUSLY. CHANGE SENSOR EVERY 14 DAYS. E11.65   empagliflozin (JARDIANCE) 10 MG TABS tablet Take 1 tablet (10 mg total) by mouth daily before breakfast.   hydrochlorothiazide (MICROZIDE) 12.5 MG capsule Take 12.5 mg by mouth every morning.   Ibuprofen-Diphenhydramine Cit (ADVIL PM PO) Take 2 tablets by mouth  at bedtime.   metFORMIN (GLUCOPHAGE) 1000 MG tablet Take 1 tablet (1,000 mg total) by mouth 2 (two) times daily with a meal.   metroNIDAZOLE (METROCREAM) 0.75 % cream Apply 1 application topically 2 (two) times daily. To face   olmesartan (BENICAR) 20 MG tablet Take 40 mg by mouth daily.   Omega-3 Fatty Acids (FISH OIL) 1200 MG CAPS Take 2,400 mg by mouth daily.   Semaglutide, 2 MG/DOSE, (OZEMPIC, 2 MG/DOSE,) 8 MG/3ML SOPN Inject 2 mg into the skin once a week.   [DISCONTINUED] Semaglutide, 1 MG/DOSE, 4 MG/3ML SOPN Inject 1 mg as directed once a week. (Patient taking differently: Inject 2 mg as directed once a week.)   No facility-administered encounter medications on file as of 11/11/2023.    ALLERGIES: Allergies  Allergen Reactions   Adhesive [Tape]     Breaks out    VACCINATION STATUS: Immunization History  Administered Date(s) Administered   Moderna Covid-19 Vaccine Bivalent Booster 46yrs & up 07/28/2021   Moderna Sars-Covid-2 Vaccination 12/07/2019,  01/07/2020    Diabetes He presents for his follow-up diabetic visit. He has type 2 diabetes mellitus. Onset time: He was diagnosed at approximate age of 41 years. His disease course has been fluctuating. There are no hypoglycemic associated symptoms. Pertinent negatives for hypoglycemia include no confusion, headaches, pallor or seizures. Pertinent negatives for diabetes include no chest pain, no fatigue, no polydipsia, no polyphagia, no polyuria and no weakness. There are no hypoglycemic complications. Symptoms are improving. There are no diabetic complications. Risk factors for coronary artery disease include diabetes mellitus, dyslipidemia, hypertension, male sex and family history. Current diabetic treatments: He is currently on Ozempic 2 mg weeklyand metformin 1000 mg p.o. twice daily, Jardiance 10 mg p.o. daily at breakfast. His weight is decreasing steadily (Patient gives history of weighing as much as 300 pounds in the past.  He continues to lose weight progressively.). He is following a generally unhealthy diet. When asked about meal planning, he reported none. His home blood glucose trend is fluctuating minimally. His breakfast blood glucose range is generally 140-180 mg/dl. His lunch blood glucose range is generally 140-180 mg/dl. His dinner blood glucose range is generally 140-180 mg/dl. His bedtime blood glucose range is generally 140-180 mg/dl. His overall blood glucose range is 140-180 mg/dl. Mario [name] presents with his CGM device showing better glucose profile.  His point-of-care A1c is 7.4%, slowly improving from 10.8%.  He did not utilize his CGM optimally.     ) An ACE inhibitor/angiotensin II receptor blocker is being taken. Eye exam is current.  Hyperlipidemia This is a chronic problem. The problem is uncontrolled. Exacerbating diseases include diabetes. Pertinent negatives include no chest pain, myalgias or shortness of breath. Current antihyperlipidemic treatment includes statins. Risk  factors for coronary artery disease include diabetes mellitus, dyslipidemia, hypertension, male sex and a sedentary lifestyle.  Hypertension This is a chronic problem. The problem is controlled. Pertinent negatives include no chest pain, headaches, neck pain, palpitations or shortness of breath. Risk factors for coronary artery disease include dyslipidemia, diabetes mellitus, family history, male gender and sedentary lifestyle. Past treatments include diuretics and angiotensin blockers.     Review of Systems  Constitutional:  Negative for chills, fatigue, fever and unexpected weight change.  HENT:  Negative for dental problem, mouth sores and trouble swallowing.   Eyes:  Negative for visual disturbance.  Respiratory:  Negative for cough, choking, chest tightness, shortness of breath and wheezing.   Cardiovascular:  Negative for chest pain, palpitations and  leg swelling.  Gastrointestinal:  Negative for abdominal distention, abdominal pain, constipation, diarrhea, nausea and vomiting.  Endocrine: Negative for polydipsia, polyphagia and polyuria.  Genitourinary:  Negative for dysuria, flank pain, hematuria and urgency.  Musculoskeletal:  Negative for back pain, gait problem, myalgias and neck pain.  Skin:  Negative for pallor, rash and wound.  Neurological:  Negative for seizures, syncope, weakness, numbness and headaches.  Psychiatric/Behavioral:  Negative for confusion and dysphoric mood.     Objective:       11/11/2023    8:18 AM 07/12/2023    9:51 AM 03/04/2023    8:22 AM  Vitals with BMI  Height 5\' 10"  5\' 10"  5\' 10"   Weight 182 lbs 10 oz 188 lbs 13 oz 188 lbs 13 oz  BMI 26.2 27.09 27.09  Systolic 134 126 829  Diastolic 86 78 84  Pulse 56 60 68    BP 134/86   Pulse (!) 56   Ht 5\' 10"  (1.778 m)   Wt 182 lb 9.6 oz (82.8 kg)   BMI 26.20 kg/m   Wt Readings from Last 3 Encounters:  11/11/23 182 lb 9.6 oz (82.8 kg)  07/12/23 188 lb 12.8 oz (85.6 kg)  03/04/23 188 lb 12.8 oz  (85.6 kg)      CMP ( most recent) CMP     Component Value Date/Time   NA 139 07/05/2023 0802   K 4.3 07/05/2023 0802   CL 105 07/05/2023 0802   CO2 22 07/05/2023 0802   GLUCOSE 173 (H) 07/05/2023 0802   GLUCOSE 106 (H) 04/05/2014 0908   BUN 13 07/05/2023 0802   CREATININE 0.86 07/05/2023 0802   CALCIUM 9.5 07/05/2023 0802   PROT 6.0 07/05/2023 0802   ALBUMIN 4.2 07/05/2023 0802   AST 14 07/05/2023 0802   ALT 25 07/05/2023 0802   ALKPHOS 63 07/05/2023 0802   BILITOT 0.9 07/05/2023 0802   GFRNONAA >90 04/05/2014 0908   GFRAA >90 04/05/2014 0908     Diabetic Labs (most recent): Lab Results  Component Value Date   HGBA1C 7.4 (A) 11/11/2023   HGBA1C 7.6 (A) 07/12/2023   HGBA1C 7.6 (A) 03/04/2023   MICROALBUR 30 08/03/2022     Lipid Panel ( most recent) Lipid Panel     Component Value Date/Time   CHOL 116 07/05/2023 0802   TRIG 129 07/05/2023 0802   HDL 44 07/05/2023 0802   CHOLHDL 2.6 07/05/2023 0802   LDLCALC 49 07/05/2023 0802   LABVLDL 23 07/05/2023 0802      Assessment & Plan:   Uncontrolled type 2 diabetes  - Mario [name] has currently uncontrolled symptomatic type 2 DM since  73 years of age.   Mario [name] presents with his CGM device showing better glucose profile.  His point-of-care A1c is 7.4%, slowly improving from 10.8%.  He did not utilize his CGM optimally.    Recent labs reviewed. - I had a long discussion with him about the progressive nature of diabetes and the pathology behind its complications. -He does not report any gross complications, however he remains at a high risk for more acute and chronic complications which include CAD, CVA, CKD, retinopathy, and neuropathy. These are all discussed in detail with him.  -  he is advised to stick to a routine mealtimes to eat 3 meals  a day and avoid unnecessary snacks ( to snack only to correct hypoglycemia).     - he acknowledges that there is a room for improvement in his food and  drink choices. -  Suggestion is made for him to avoid simple carbohydrates  from his diet including Cakes, Sweet Desserts, Ice Cream, Soda (diet and regular), Sweet Tea, Candies, Chips, Cookies, Store Bought Juices, Alcohol , Artificial Sweeteners,  Coffee Creamer, and "Sugar-free" Products, Lemonade. This will help patient to have more stable blood glucose profile and potentially avoid unintended weight gain.  The following Lifestyle Medicine recommendations according to American College of Lifestyle Medicine  [facility]) were discussed and and offered to patient and he  agrees to start the journey:  A. Whole Foods, Plant-Based Nutrition comprising of fruits and vegetables, plant-based proteins, whole-grain carbohydrates was discussed in detail with the patient.   A list for source of those nutrients were also provided to the patient.  Patient will use only water or unsweetened tea for hydration. B.  The need to stay away from risky substances including alcohol, smoking; obtaining 7 to 9 hours of restorative sleep, at least 150 minutes of moderate intensity exercise weekly, the importance of healthy social connections,  and stress management techniques were discussed. C.  A full color page of  Calorie density of various food groups per pound showing examples of each food groups was provided to the patient.  - I have approached him with the following individualized plan to manage  his diabetes and patient agrees:   -He continues to respond to lifestyle medicine achieving control of his diabetes as well as hyperlipidemia.    -He is advised to continue Ozempic at 2 mg subcutaneously weekly, metformin 1000 mg p.o. twice daily.  I discussed and increase his Jardiance to 25 mg p.o. daily at breakfast.   Side effects and precautions discussed with him. He is encouraged to utilize his CGM properly.    - he is encouraged to call clinic for blood glucose levels less than 70 or above 200 mg /dl at fasting. - Specific targets for   A1c;  LDL, HDL,  and Triglycerides were discussed with the patient.  2) Blood Pressure /Hypertension:   -His blood pressure is controlled to target.  he is advised to continue his current medications including hydrochlorothiazide 12.5 mg p.o. daily mg p.o. daily with breakfast, Benicar 40 mg p.o. daily at breakfast.  3) Lipids/Hyperlipidemia:   Review of his recent lipid panel showed in improved lipid panel with LDL of 49.  He is advised to continue atorvastatin 40 mg p.o. nightly.       Side effects and precautions discussed with him.  He will be considered for fasting lipid panel before his next visit.   4)  Weight/Diet: His BMI is 26.2--not a candidate for major weight loss.    He has achieved adequate weight loss already,    he is not a candidate for major weight loss.  He has achieved weight loss of more than 150 pounds over the years.  I discussed with him the fact that loss of 5 - 10% of his  current body weight will have the most impact on his diabetes management.  Exercise, and detailed carbohydrates information provided  -  detailed on discharge instructions.  5) Chronic Care/Health Maintenance:  -he  is on ACEI/ARB and Statin medications and  is encouraged to initiate and continue to follow up with Ophthalmology, Dentist,  Podiatrist at least yearly or according to recommendations, and advised to   stay away from smoking. I have recommended yearly flu vaccine and pneumonia vaccine at least every 5 years; moderate intensity exercise for up to 150  minutes weekly; and  sleep for at least 7 hours a day.  Diabetes foot exam is normal on Mar 12, 2023  - he is  advised to maintain close follow up with [facility] family medicine for primary care needs,  as well as his other providers for optimal and coordinated care.   I spent  26  minutes in the care of the patient today including review of labs from CMP, Lipids, Thyroid Function, Hematology (current and previous including abstractions from  other facilities); face-to-face time discussing  his blood glucose readings/logs, discussing hypoglycemia and hyperglycemia episodes and symptoms, medications doses, his options of short and long term treatment based on the latest standards of care / guidelines;  discussion about incorporating lifestyle medicine;  and documenting the encounter. Risk reduction counseling performed per USPSTF guidelines to reduce cardiovascular risk factors.     Please refer to Patient Instructions for Blood Glucose Monitoring and Insulin/Medications Dosing Guide"  in media tab for additional information. Please  also refer to " Patient Self Inventory" in the Media  tab for reviewed elements of pertinent patient history.  Mario [name] participated in the discussions, expressed understanding, and voiced agreement with the above plans.  All questions were answered to his satisfaction. he is encouraged to contact clinic should he have any questions or concerns prior to his return visit.    Follow up plan: - Return in about 4 months (around 03/10/2024) for Bring Meter/CGM Device/Logs- A1c in Office.  Marquis Lunch, MD [facility] [address] [place], [place] [address] Phone: [phone]  Fax: [phone]    11/11/2023, 8:38 AM  This note was partially dictated with voice recognition software. Similar sounding words can be transcribed inadequately or may not  be corrected upon review.

## 2024-01-16 ENCOUNTER — Other Ambulatory Visit: Payer: Self-pay | Admitting: "Endocrinology

## 2024-01-16 DIAGNOSIS — E1165 Type 2 diabetes mellitus with hyperglycemia: Secondary | ICD-10-CM

## 2024-01-25 ENCOUNTER — Telehealth: Payer: Self-pay | Admitting: Pharmacy Technician

## 2024-01-25 ENCOUNTER — Other Ambulatory Visit (HOSPITAL_COMMUNITY): Payer: Self-pay

## 2024-01-25 NOTE — Telephone Encounter (Signed)
 Pharmacy Patient Advocate Encounter   Received notification from CoverMyMeds that prior authorization for Ozempic (2 MG/DOSE) 8MG /3ML pen-injectors is required/requested.   Insurance verification completed.   The patient is insured through [facility] ADVANTAGE/RX ADVANCE .   Per test claim: PA required; PA submitted to above mentioned insurance via CoverMyMeds Key/confirmation #/[id number] [facility] Status is pending

## 2024-01-26 NOTE — Telephone Encounter (Signed)
 Pt made aware

## 2024-01-26 NOTE — Telephone Encounter (Signed)
 Insurance is requesting an Appeal.Information in [place]

## 2024-01-26 NOTE — Telephone Encounter (Signed)
 Pharmacy Patient Advocate Encounter  Received notification from [facility] ADVANTAGE/RX ADVANCE that Prior Authorization for Ozempic has been APPROVED through 01/24/2025

## 2024-02-07 ENCOUNTER — Telehealth: Payer: Self-pay | Admitting: "Endocrinology

## 2024-02-07 DIAGNOSIS — E1165 Type 2 diabetes mellitus with hyperglycemia: Secondary | ICD-10-CM

## 2024-02-07 MED ORDER — FREESTYLE LIBRE 2 PLUS SENSOR MISC: 1.0000 | 2 refills | Status: DC

## 2024-02-07 NOTE — Telephone Encounter (Signed)
 Rx for [employer] 2 Plus sensors sent to [facility] on [address] Per pt's request.

## 2024-02-07 NOTE — Telephone Encounter (Signed)
 [facility] on [address] left a VM stating that they need a RX sent in for either [place] 2 plus or [employer] as the [place] 2 is being discontinued

## 2024-02-17 ENCOUNTER — Other Ambulatory Visit: Payer: Self-pay | Admitting: "Endocrinology

## 2024-03-12 ENCOUNTER — Ambulatory Visit (INDEPENDENT_AMBULATORY_CARE_PROVIDER_SITE_OTHER): Payer: PPO | Admitting: "Endocrinology

## 2024-03-12 ENCOUNTER — Encounter: Payer: Self-pay | Admitting: "Endocrinology

## 2024-03-12 VITALS — BP 124/76 | HR 64 | Ht 70.0 in | Wt 181.4 lb

## 2024-03-12 DIAGNOSIS — I10 Essential (primary) hypertension: Secondary | ICD-10-CM | POA: Diagnosis not present

## 2024-03-12 DIAGNOSIS — E1165 Type 2 diabetes mellitus with hyperglycemia: Secondary | ICD-10-CM

## 2024-03-12 DIAGNOSIS — Z7984 Long term (current) use of oral hypoglycemic drugs: Secondary | ICD-10-CM | POA: Diagnosis not present

## 2024-03-12 DIAGNOSIS — E782 Mixed hyperlipidemia: Secondary | ICD-10-CM | POA: Diagnosis not present

## 2024-03-12 LAB — POCT GLYCOSYLATED HEMOGLOBIN (HGB A1C): HbA1c, POC (controlled diabetic range): 7.3 % — AB (ref 0.0–7.0)

## 2024-03-12 NOTE — Progress Notes (Signed)
 03/12/2024, 8:34 AM  Endocrinology follow-up note   Subjective:    Patient ID: Mario [name], male    DOB: August 17, 1951.  Mario [name] is being seen in follow-up after he was seen in consultation for  management of currently uncontrolled symptomatic diabetes requested by  Rae Bugler, MD.   Past Medical History:  Diagnosis Date   Chronic back pain    radiculopatly  and stenosis   Complication of anesthesia    woke up being very agitated   Diabetes mellitus without complication (HCC)    takes Metformin  and Amaryl  daily   History of gout 23yrs ago   History of kidney stones 30+yrs ago   History of shingles 1990   Hyperlipidemia    takes Atorvastatin  daily   Rosacea    uses Metronidazole  daily    Past Surgical History:  Procedure Laterality Date   CERVICAL FUSION  1998   COLONOSCOPY     LUMBAR LAMINECTOMY/DECOMPRESSION MICRODISCECTOMY N/A 04/15/2014   Procedure: LUMBAR LAMINECTOMY/DECOMPRESSION MICRODISCECTOMY 2 LEVELS  lumbar three/four, four/five  ;  Surgeon: Elder Greening, MD;  Location: [facility];  Service: Neurosurgery;  Laterality: N/A;   TONSILLECTOMY  1958    Social History   Socioeconomic History   Marital status: Married    Spouse name: Not on file   Number of children: Not on file   Years of education: Not on file   Highest education level: Not on file  Occupational History   Not on file  Tobacco Use   Smoking status: Never   Smokeless tobacco: Not on file  Substance and Sexual Activity   Alcohol use: No   Drug use: No   Sexual activity: Yes  Other Topics Concern   Not on file  Social History Narrative   Not on file   Social Drivers of Health   Financial Resource Strain: Not on file  Food Insecurity: Not on file  Transportation Needs: Not on file  Physical Activity: Not on file  Stress: Not on file  Social Connections: Not on file    Family History   Problem Relation Age of Onset   Hypertension Mother    Diabetes Mother    Hyperlipidemia Mother    Heart attack Mother    Heart failure Mother    Cancer Father    Heart attack Father    Hyperlipidemia Father    Hypertension Father     Outpatient Encounter Medications as of 03/12/2024  Medication Sig   aspirin EC 325 MG tablet Take 325 mg by mouth daily.   atorvastatin  (LIPITOR) 40 MG tablet Take 40 mg by mouth at bedtime.   Continuous Blood Gluc Receiver (FREESTYLE LIBRE 2 READER) DEVI USE TO CHECK BLOOD SUGAR AS DIRECTED   Continuous Glucose Sensor (FREESTYLE LIBRE 2 PLUS SENSOR) MISC 1 each by Does not apply route as directed. Use to monitor glucose continuously as directed. Change sensor every 15 days.   empagliflozin  (JARDIANCE ) 25 MG TABS tablet TAKE 1 TABLET(25 MG) BY MOUTH DAILY WITH BREAKFAST   hydrochlorothiazide (MICROZIDE) 12.5 MG capsule Take 12.5 mg by mouth every morning.   Ibuprofen-Diphenhydramine Cit (  ADVIL PM PO) Take 2 tablets by mouth at bedtime.   metFORMIN  (GLUCOPHAGE ) 1000 MG tablet Take 1 tablet (1,000 mg total) by mouth 2 (two) times daily with a meal.   metroNIDAZOLE  (METROCREAM ) 0.75 % cream Apply 1 application topically 2 (two) times daily. To face   olmesartan (BENICAR) 20 MG tablet Take 40 mg by mouth daily.   Omega-3 Fatty Acids (FISH OIL) 1200 MG CAPS Take 2,400 mg by mouth daily.   OZEMPIC , 2 MG/DOSE, 8 MG/3ML SOPN INJECT 2 MG INTO THE SKIN ONCE A WEEK   No facility-administered encounter medications on file as of 03/12/2024.    ALLERGIES: Allergies  Allergen Reactions   Adhesive [Tape]     Breaks out    VACCINATION STATUS: Immunization History  Administered Date(s) Administered   Moderna Covid-19 Vaccine Bivalent Booster 48yrs & up 07/28/2021   Moderna Sars-Covid-2 Vaccination 12/07/2019, 01/07/2020    Diabetes He presents for his follow-up diabetic visit. He has type 2 diabetes mellitus. Onset time: He was diagnosed at approximate age of  41 years. His disease course has been stable. There are no hypoglycemic associated symptoms. Pertinent negatives for hypoglycemia include no confusion, headaches, pallor or seizures. Pertinent negatives for diabetes include no chest pain, no fatigue, no polydipsia, no polyphagia, no polyuria and no weakness. There are no hypoglycemic complications. Symptoms are stable. There are no diabetic complications. Risk factors for coronary artery disease include diabetes mellitus, dyslipidemia, hypertension, male sex and family history. His weight is decreasing steadily (Patient gives history of weighing as much as 300 pounds in the past.  He continues to lose weight progressively.). He is following a generally unhealthy diet. When asked about meal planning, he reported none. His home blood glucose trend is decreasing steadily. His breakfast blood glucose range is generally 140-180 mg/dl. His lunch blood glucose range is generally 140-180 mg/dl. His dinner blood glucose range is generally 140-180 mg/dl. His bedtime blood glucose range is generally 140-180 mg/dl. His overall blood glucose range is 140-180 mg/dl. Mario [name] presents with his CGM device showing average blood glucose of 159 for the most recent 14 days.  His AGP report shows 75% time range, 22% level 1 hyperglycemia, 3% level 2 hyperglycemia.  He has no hypoglycemia.  His point-of-care A1c is 7.3%, progressively improving from 10.8%.    He still did not utilize his CGM optimally.     ) An ACE inhibitor/angiotensin II receptor blocker is being taken. Eye exam is current.  Hyperlipidemia This is a chronic problem. The problem is uncontrolled. Exacerbating diseases include diabetes. Pertinent negatives include no chest pain, myalgias or shortness of breath. Current antihyperlipidemic treatment includes statins. Risk factors for coronary artery disease include diabetes mellitus, dyslipidemia, hypertension, male sex and a sedentary lifestyle.  Hypertension This is  a chronic problem. The problem is controlled. Pertinent negatives include no chest pain, headaches, neck pain, palpitations or shortness of breath. Risk factors for coronary artery disease include dyslipidemia, diabetes mellitus, family history, male gender and sedentary lifestyle. Past treatments include diuretics and angiotensin blockers.     Objective:       03/12/2024    8:15 AM 11/11/2023    8:18 AM 07/12/2023    9:51 AM  Vitals with BMI  Height 5\' 10"  5\' 10"  5\' 10"   Weight 181 lbs 6 oz 182 lbs 10 oz 188 lbs 13 oz  BMI 26.03 26.2 27.09  Systolic 124 134 409  Diastolic 76 86 78  Pulse 64 56 60  BP 124/76   Pulse 64   Ht 5\' 10"  (1.778 m)   Wt 181 lb 6.4 oz (82.3 kg)   BMI 26.03 kg/m   Wt Readings from Last 3 Encounters:  03/12/24 181 lb 6.4 oz (82.3 kg)  11/11/23 182 lb 9.6 oz (82.8 kg)  07/12/23 188 lb 12.8 oz (85.6 kg)      CMP ( most recent) CMP     Component Value Date/Time   NA 139 07/05/2023 0802   K 4.3 07/05/2023 0802   CL 105 07/05/2023 0802   CO2 22 07/05/2023 0802   GLUCOSE 173 (H) 07/05/2023 0802   GLUCOSE 106 (H) 04/05/2014 0908   BUN 13 07/05/2023 0802   CREATININE 0.86 07/05/2023 0802   CALCIUM  9.5 07/05/2023 0802   PROT 6.0 07/05/2023 0802   ALBUMIN  4.2 07/05/2023 0802   AST 14 07/05/2023 0802   ALT 25 07/05/2023 0802   ALKPHOS 63 07/05/2023 0802   BILITOT 0.9 07/05/2023 0802   GFRNONAA >90 04/05/2014 0908   GFRAA >90 04/05/2014 0908     Diabetic Labs (most recent): Lab Results  Component Value Date   HGBA1C 7.3 (A) 03/12/2024   HGBA1C 7.4 (A) 11/11/2023   HGBA1C 7.6 (A) 07/12/2023   MICROALBUR 30 08/03/2022     Lipid Panel ( most recent) Lipid Panel     Component Value Date/Time   CHOL 116 07/05/2023 0802   TRIG 129 07/05/2023 0802   HDL 44 07/05/2023 0802   CHOLHDL 2.6 07/05/2023 0802   LDLCALC 49 07/05/2023 0802   LABVLDL 23 07/05/2023 0802      Assessment & Plan:   Uncontrolled type 2 diabetes  - Mario [name]  has currently uncontrolled symptomatic type 2 DM since  73 years of age.   [place] presents with his CGM device showing average blood glucose of 159 for the most recent 14 days.  His AGP report shows 75% time range, 22% level 1 hyperglycemia, 3% level 2 hyperglycemia.  He has no hypoglycemia.  His point-of-care A1c is 7.3%, progressively improving from 10.8%.    He still did not utilize his CGM optimally.    Recent labs reviewed. - I had a long discussion with him about the progressive nature of diabetes and the pathology behind its complications. -He does not report any gross complications, however he remains at a high risk for more acute and chronic complications which include CAD, CVA, CKD, retinopathy, and neuropathy. These are all discussed in detail with him.  -  he is advised to stick to a routine mealtimes to eat 3 meals  a day and avoid unnecessary snacks ( to snack only to correct hypoglycemia).     - he acknowledges that there is a room for improvement in his food and drink choices. - Suggestion is made for him to avoid simple carbohydrates  from his diet including Cakes, Sweet Desserts, Ice Cream, Soda (diet and regular), Sweet Tea, Candies, Chips, Cookies, Store Bought Juices, Alcohol , Artificial Sweeteners,  Coffee Creamer, and "Sugar-free" Products, Lemonade. This will help patient to have more stable blood glucose profile and potentially avoid unintended weight gain.  The following Lifestyle Medicine recommendations according to American College of Lifestyle Medicine  [facility]) were discussed and and offered to patient and he  agrees to start the journey:  A. Whole Foods, Plant-Based Nutrition comprising of fruits and vegetables, plant-based proteins, whole-grain carbohydrates was discussed in detail with the patient.   A list for source of those nutrients  were also provided to the patient.  Patient will use only water or unsweetened tea for hydration. B.  The need to stay away from  risky substances including alcohol, smoking; obtaining 7 to 9 hours of restorative sleep, at least 150 minutes of moderate intensity exercise weekly, the importance of healthy social connections,  and stress management techniques were discussed. C.  A full color page of  Calorie density of various food groups per pound showing examples of each food groups was provided to the patient.   - I have approached him with the following individualized plan to manage  his diabetes and patient agrees:   -He continues to respond to lifestyle medicine achieving control of his diabetes as well as hyperlipidemia.   -He is advised to continue Ozempic  2 mg subcutaneously weekly,  metformin  1000 mg p.o. twice daily.  He is tolerating Jardiance  25 mg, advised to continue daily at breakfast.    Side effects and precautions discussed with him. He is encouraged to utilize his CGM properly.    - he is encouraged to call clinic for blood glucose levels less than 70 or above 200 mg /dl weekly average.    - Specific targets for  A1c;  LDL, HDL,  and Triglycerides were discussed with the patient.  2) Blood Pressure /Hypertension:   - His blood pressure is controlled to target.  he is advised to continue his current medications including hydrochlorothiazide 12.5 mg p.o. daily mg p.o. daily with breakfast, Benicar 40 mg p.o. daily at breakfast.  3) Lipids/Hyperlipidemia:   Review of his recent lipid panel showed in improved lipid panel with LDL of 49.  He is advised to continue atorvastatin  40 mg p.o. nightly.      Side effects and precautions discussed with him.  He will be considered for fasting lipid panel before his next visit.   4)  Weight/Diet: His BMI is 26.03--not a candidate for major weight loss.    He has achieved adequate weight loss already,    he is not a candidate for major weight loss.  He has achieved weight loss of more than 150 pounds over the years.  I discussed with him the fact that loss of 5 - 10%  of his  current body weight will have the most impact on his diabetes management.  Exercise, and detailed carbohydrates information provided  -  detailed on discharge instructions.  5) Chronic Care/Health Maintenance:  -he  is on ACEI/ARB and Statin medications and  is encouraged to initiate and continue to follow up with Ophthalmology, Dentist,  Podiatrist at least yearly or according to recommendations, and advised to   stay away from smoking. I have recommended yearly flu vaccine and pneumonia vaccine at least every 5 years; moderate intensity exercise for up to 150 minutes weekly; and  sleep for at least 7 hours a day.  Diabetes foot exam is normal on Mar 12, 2023  - he is  advised to maintain close follow up with [facility] family medicine for primary care needs,  as well as his other providers for optimal and coordinated care.    I spent  26  minutes in the care of the patient today including review of labs from CMP, Lipids, Thyroid  Function, Hematology (current and previous including abstractions from other facilities); face-to-face time discussing  his blood glucose readings/logs, discussing hypoglycemia and hyperglycemia episodes and symptoms, medications doses, his options of short and long term treatment based on the latest standards of care / guidelines;  discussion about incorporating lifestyle medicine;  and documenting the encounter. Risk reduction counseling performed per USPSTF guidelines to reduce  cardiovascular risk factors.     Please refer to Patient Instructions for Blood Glucose Monitoring and Insulin/Medications Dosing Guide"  in media tab for additional information. Please  also refer to " Patient Self Inventory" in the Media  tab for reviewed elements of pertinent patient history.  Mario [name] participated in the discussions, expressed understanding, and voiced agreement with the above plans.  All questions were answered to his satisfaction. he is encouraged to contact clinic  should he have any questions or concerns prior to his return visit.   Follow up plan: - Return in about 4 months (around 07/13/2024) for F/U with Pre-visit Labs, Meter/CGM/Logs, A1c here.  Kalvin Orf, MD [facility] [address] [place], [place] [address] Phone: [phone]  Fax: [phone]    03/12/2024, 8:34 AM  This note was partially dictated with voice recognition software. Similar sounding words can be transcribed inadequately or may not  be corrected upon review.

## 2024-03-12 NOTE — Patient Instructions (Signed)

## 2024-05-26 ENCOUNTER — Other Ambulatory Visit: Payer: Self-pay | Admitting: "Endocrinology

## 2024-05-26 DIAGNOSIS — E1165 Type 2 diabetes mellitus with hyperglycemia: Secondary | ICD-10-CM

## 2024-06-15 ENCOUNTER — Other Ambulatory Visit: Payer: Self-pay | Admitting: "Endocrinology

## 2024-06-15 DIAGNOSIS — E1165 Type 2 diabetes mellitus with hyperglycemia: Secondary | ICD-10-CM

## 2024-06-20 DIAGNOSIS — D225 Melanocytic nevi of trunk: Secondary | ICD-10-CM | POA: Diagnosis not present

## 2024-06-20 DIAGNOSIS — L853 Xerosis cutis: Secondary | ICD-10-CM | POA: Diagnosis not present

## 2024-06-20 DIAGNOSIS — D2261 Melanocytic nevi of right upper limb, including shoulder: Secondary | ICD-10-CM | POA: Diagnosis not present

## 2024-06-20 DIAGNOSIS — L218 Other seborrheic dermatitis: Secondary | ICD-10-CM | POA: Diagnosis not present

## 2024-06-20 DIAGNOSIS — L821 Other seborrheic keratosis: Secondary | ICD-10-CM | POA: Diagnosis not present

## 2024-06-20 DIAGNOSIS — L570 Actinic keratosis: Secondary | ICD-10-CM | POA: Diagnosis not present

## 2024-06-20 DIAGNOSIS — D2262 Melanocytic nevi of left upper limb, including shoulder: Secondary | ICD-10-CM | POA: Diagnosis not present

## 2024-06-20 DIAGNOSIS — Z85828 Personal history of other malignant neoplasm of skin: Secondary | ICD-10-CM | POA: Diagnosis not present

## 2024-06-20 DIAGNOSIS — D1801 Hemangioma of skin and subcutaneous tissue: Secondary | ICD-10-CM | POA: Diagnosis not present

## 2024-07-11 DIAGNOSIS — E1165 Type 2 diabetes mellitus with hyperglycemia: Secondary | ICD-10-CM | POA: Diagnosis not present

## 2024-07-12 LAB — COMPREHENSIVE METABOLIC PANEL WITH GFR
ALT: 24 IU/L (ref 0–44)
AST: 19 IU/L (ref 0–40)
Albumin: 4.3 g/dL (ref 3.8–4.8)
Alkaline Phosphatase: 66 IU/L (ref 47–123)
BUN/Creatinine Ratio: 14 (ref 10–24)
BUN: 14 mg/dL (ref 8–27)
Bilirubin Total: 1.1 mg/dL (ref 0.0–1.2)
CO2: 20 mmol/L (ref 20–29)
Calcium: 9.7 mg/dL (ref 8.6–10.2)
Chloride: 105 mmol/L (ref 96–106)
Creatinine, Ser: 0.99 mg/dL (ref 0.76–1.27)
Globulin, Total: 1.7 g/dL (ref 1.5–4.5)
Glucose: 117 mg/dL — ABNORMAL HIGH (ref 70–99)
Potassium: 4 mmol/L (ref 3.5–5.2)
Sodium: 141 mmol/L (ref 134–144)
Total Protein: 6 g/dL (ref 6.0–8.5)
eGFR: 81 mL/min/1.73 (ref >59)

## 2024-07-12 LAB — LIPID PANEL
Chol/HDL Ratio: 2.7 ratio (ref 0.0–5.0)
Cholesterol, Total: 115 mg/dL (ref 100–199)
HDL: 42 mg/dL (ref >39)
LDL Chol Calc (NIH): 53 mg/dL (ref 0–99)
Triglycerides: 107 mg/dL (ref 0–149)
VLDL Cholesterol Cal: 20 mg/dL (ref 5–40)

## 2024-07-19 ENCOUNTER — Ambulatory Visit: Admitting: "Endocrinology

## 2024-07-26 ENCOUNTER — Encounter: Payer: Self-pay | Admitting: "Endocrinology

## 2024-07-26 ENCOUNTER — Ambulatory Visit (INDEPENDENT_AMBULATORY_CARE_PROVIDER_SITE_OTHER): Admitting: "Endocrinology

## 2024-07-26 VITALS — BP 138/70 | HR 64 | Ht 70.0 in | Wt 183.0 lb

## 2024-07-26 DIAGNOSIS — Z7985 Long-term (current) use of injectable non-insulin antidiabetic drugs: Secondary | ICD-10-CM | POA: Diagnosis not present

## 2024-07-26 DIAGNOSIS — I10 Essential (primary) hypertension: Secondary | ICD-10-CM | POA: Diagnosis not present

## 2024-07-26 DIAGNOSIS — E1165 Type 2 diabetes mellitus with hyperglycemia: Secondary | ICD-10-CM

## 2024-07-26 DIAGNOSIS — E782 Mixed hyperlipidemia: Secondary | ICD-10-CM | POA: Diagnosis not present

## 2024-07-26 LAB — POCT GLYCOSYLATED HEMOGLOBIN (HGB A1C): HbA1c, POC (controlled diabetic range): 7.1 % — AB (ref 0.0–7.0)

## 2024-07-26 NOTE — Patient Instructions (Signed)

## 2024-07-26 NOTE — Progress Notes (Signed)
 07/26/2024, 11:22 AM  Endocrinology follow-up note   Subjective:    Patient ID: Mario [name], male    DOB: 11/22/1950.  Mario [name] is being seen in follow-up after he was seen in consultation for  management of currently uncontrolled symptomatic diabetes requested by  Mario Lenis, MD.   Past Medical History:  Diagnosis Date   Chronic back pain    radiculopatly  and stenosis   Complication of anesthesia    woke up being very agitated   Diabetes mellitus without complication (HCC)    takes Metformin  and Amaryl  daily   History of gout 13yrs ago   History of kidney stones 30+yrs ago   History of shingles 1990   Hyperlipidemia    takes Atorvastatin  daily   Rosacea    uses Metronidazole  daily    Past Surgical History:  Procedure Laterality Date   CERVICAL FUSION  1998   COLONOSCOPY     LUMBAR LAMINECTOMY/DECOMPRESSION MICRODISCECTOMY N/A 04/15/2014   Procedure: LUMBAR LAMINECTOMY/DECOMPRESSION MICRODISCECTOMY 2 LEVELS  lumbar three/four, four/five  ;  Surgeon: Mario JONETTA Budge, MD;  Location: [facility];  Service: Neurosurgery;  Laterality: N/A;   TONSILLECTOMY  1958    Social History   Socioeconomic History   Marital status: Married    Spouse name: Not on file   Number of children: Not on file   Years of education: Not on file   Highest education level: Not on file  Occupational History   Not on file  Tobacco Use   Smoking status: Never   Smokeless tobacco: Not on file  Substance and Sexual Activity   Alcohol use: No   Drug use: No   Sexual activity: Yes  Other Topics Concern   Not on file  Social History Narrative   Not on file   Social Drivers of Health   Financial Resource Strain: Not on file  Food Insecurity: Not on file  Transportation Needs: Not on file  Physical Activity: Not on file  Stress: Not on file  Social Connections: Not on file    Family History   Problem Relation Age of Onset   Hypertension Mother    Diabetes Mother    Hyperlipidemia Mother    Heart attack Mother    Heart failure Mother    Cancer Father    Heart attack Father    Hyperlipidemia Father    Hypertension Father     Outpatient Encounter Medications as of 07/26/2024  Medication Sig   aspirin EC 325 MG tablet Take 325 mg by mouth daily.   atorvastatin  (LIPITOR) 40 MG tablet Take 40 mg by mouth at bedtime.   Continuous Blood Gluc Receiver (FREESTYLE LIBRE 2 READER) DEVI USE TO CHECK BLOOD SUGAR AS DIRECTED   Continuous Glucose Sensor (FREESTYLE LIBRE 2 PLUS SENSOR) MISC 1 each by Does not apply route as directed. Use to monitor glucose continuously as directed. Change sensor every 15 days.   empagliflozin  (JARDIANCE ) 25 MG TABS tablet TAKE 1 TABLET(25 MG) BY MOUTH DAILY WITH BREAKFAST   hydrochlorothiazide (MICROZIDE) 12.5 MG capsule Take 12.5 mg by mouth every morning.   Ibuprofen-Diphenhydramine Cit (  ADVIL PM PO) Take 2 tablets by mouth at bedtime.   metFORMIN  (GLUCOPHAGE ) 1000 MG tablet Take 1 tablet (1,000 mg total) by mouth 2 (two) times daily with a meal.   metroNIDAZOLE  (METROCREAM ) 0.75 % cream Apply 1 application topically 2 (two) times daily. To face   olmesartan (BENICAR) 20 MG tablet Take 40 mg by mouth daily.   Omega-3 Fatty Acids (FISH OIL) 1200 MG CAPS Take 2,400 mg by mouth daily.   OZEMPIC , 2 MG/DOSE, 8 MG/3ML SOPN INJECT 2 MG UNDER THE SKIN ONCE A WEEK   No facility-administered encounter medications on file as of 07/26/2024.    ALLERGIES: Allergies  Allergen Reactions   Adhesive [Tape]     Breaks out    VACCINATION STATUS: Immunization History  Administered Date(s) Administered   Moderna Covid-19 Vaccine Bivalent Booster 45yrs & up 07/28/2021   Moderna Sars-Covid-2 Vaccination 12/07/2019, 01/07/2020    Diabetes He presents for his follow-up diabetic visit. He has type 2 diabetes mellitus. Onset time: He was diagnosed at approximate age  of 41 years. His disease course has been stable. There are no hypoglycemic associated symptoms. Pertinent negatives for hypoglycemia include no confusion, headaches, pallor or seizures. Pertinent negatives for diabetes include no chest pain, no fatigue, no polydipsia, no polyphagia, no polyuria and no weakness. There are no hypoglycemic complications. Symptoms are improving. There are no diabetic complications. Risk factors for coronary artery disease include diabetes mellitus, dyslipidemia, hypertension, male sex and family history. His weight is fluctuating minimally (Patient gives history of weighing as much as 300 pounds in the past.  He continues to lose weight progressively.). He is following a generally unhealthy diet. When asked about meal planning, he reported none. His home blood glucose trend is fluctuating minimally. His breakfast blood glucose range is generally 140-180 mg/dl. His lunch blood glucose range is generally 140-180 mg/dl. His dinner blood glucose range is generally 140-180 mg/dl. His bedtime blood glucose range is generally 140-180 mg/dl. His overall blood glucose range is 140-180 mg/dl. Mario [name] presents with his CGM device showing continued improvement in his glycemic profile averaging 132 mg per DL for the most recent 14 days.  His point-of-care A1c is 7.1%.  He is AGP report shows 95% time in range, 5% level 1 hyperglycemia.  She did not document any hypoglycemia.  Still using freestyle libre 2 device and missing  daytime data.    ) An ACE inhibitor/angiotensin II receptor blocker is being taken. Eye exam is current.  Hyperlipidemia This is a chronic problem. The problem is uncontrolled. Exacerbating diseases include diabetes. Pertinent negatives include no chest pain, myalgias or shortness of breath. Current antihyperlipidemic treatment includes statins. Risk factors for coronary artery disease include diabetes mellitus, dyslipidemia, hypertension, male sex and a sedentary  lifestyle.  Hypertension This is a chronic problem. The problem is controlled. Pertinent negatives include no chest pain, headaches, neck pain, palpitations or shortness of breath. Risk factors for coronary artery disease include dyslipidemia, diabetes mellitus, family history, male gender and sedentary lifestyle. Past treatments include diuretics and angiotensin blockers.     Objective:       07/26/2024    9:12 AM 03/12/2024    8:15 AM 11/11/2023    8:18 AM  Vitals with BMI  Height 5' 10 5' 10 5' 10  Weight 183 lbs 181 lbs 6 oz 182 lbs 10 oz  BMI 26.26 26.03 26.2  Systolic 138 124 865  Diastolic 70 76 86  Pulse 64 64 56  BP 138/70   Pulse 64   Ht 5' 10 (1.778 m)   Wt 183 lb (83 kg)   BMI 26.26 kg/m   Wt Readings from Last 3 Encounters:  07/26/24 183 lb (83 kg)  03/12/24 181 lb 6.4 oz (82.3 kg)  11/11/23 182 lb 9.6 oz (82.8 kg)      CMP ( most recent) CMP     Component Value Date/Time   NA 141 07/11/2024 0749   K 4.0 07/11/2024 0749   CL 105 07/11/2024 0749   CO2 20 07/11/2024 0749   GLUCOSE 117 (H) 07/11/2024 0749   GLUCOSE 106 (H) 04/05/2014 0908   BUN 14 07/11/2024 0749   CREATININE 0.99 07/11/2024 0749   CALCIUM  9.7 07/11/2024 0749   PROT 6.0 07/11/2024 0749   ALBUMIN  4.3 07/11/2024 0749   AST 19 07/11/2024 0749   ALT 24 07/11/2024 0749   ALKPHOS 66 07/11/2024 0749   BILITOT 1.1 07/11/2024 0749   GFRNONAA >90 04/05/2014 0908   GFRAA >90 04/05/2014 0908     Diabetic Labs (most recent): Lab Results  Component Value Date   HGBA1C 7.1 (A) 07/26/2024   HGBA1C 7.3 (A) 03/12/2024   HGBA1C 7.4 (A) 11/11/2023   MICROALBUR 30 08/03/2022     Lipid Panel ( most recent) Lipid Panel     Component Value Date/Time   CHOL 115 07/11/2024 0749   TRIG 107 07/11/2024 0749   HDL 42 07/11/2024 0749   CHOLHDL 2.7 07/11/2024 0749   LDLCALC 53 07/11/2024 0749   LABVLDL 20 07/11/2024 0749      Assessment & Plan:   Uncontrolled type 2 diabetes  - Mario [name] has currently uncontrolled symptomatic type 2 DM since  73 years of age.   Teague presents with his CGM device showing continued improvement in his glycemic profile averaging 132 mg per DL for the most recent 14 days.  His point-of-care A1c is 7.1%.  He is AGP report shows 95% time in range, 5% level 1 hyperglycemia.  She did not document any hypoglycemia.  Still using freestyle libre 2 device and missing  daytime data.  Recent labs reviewed. - I had a long discussion with him about the progressive nature of diabetes and the pathology behind its complications. -He does not report any gross complications, however he remains at a high risk for more acute and chronic complications which include CAD, CVA, CKD, retinopathy, and neuropathy. These are all discussed in detail with him.  -  he is advised to stick to a routine mealtimes to eat 3 meals  a day and avoid unnecessary snacks ( to snack only to correct hypoglycemia).   - he acknowledges that there is a room for improvement in his food and drink choices. - Suggestion is made for him to avoid simple carbohydrates  from his diet including Cakes, Sweet Desserts, Ice Cream, Soda (diet and regular), Sweet Tea, Candies, Chips, Cookies, Store Bought Juices, Alcohol , Artificial Sweeteners,  Coffee Creamer, and Sugar-free Products, Lemonade. This will help patient to have more stable blood glucose profile and potentially avoid unintended weight gain.  The following Lifestyle Medicine recommendations according to American College of Lifestyle Medicine  [facility]) were discussed and and offered to patient and he  agrees to start the journey:  A. Whole Foods, Plant-Based Nutrition comprising of fruits and vegetables, plant-based proteins, whole-grain carbohydrates was discussed in detail with the patient.   A list for source of those nutrients were also provided to the  patient.  Patient will use only water or unsweetened tea for hydration. B.  The need to  stay away from risky substances including alcohol, smoking; obtaining 7 to 9 hours of restorative sleep, at least 150 minutes of moderate intensity exercise weekly, the importance of healthy social connections,  and stress management techniques were discussed. C.  A full color page of  Calorie density of various food groups per pound showing examples of each food groups was provided to the patient.  - I have approached him with the following individualized plan to manage  his diabetes and patient agrees:   -He continues to respond to lifestyle medicine achieving control of his diabetes as well as hyperlipidemia.  He does not wish to lose any more weight. -He is advised to continue Ozempic  2 mg subcutaneously weekly, metformin  1000 mg p.o. twice daily, Jardiance  25 mg p.o. once a day  Side effects and precautions discussed with him. He is encouraged to utilize his CGM properly.    - he is encouraged to call clinic for blood glucose levels less than 70 or above 200 mg /dl weekly average.    - Specific targets for  A1c;  LDL, HDL,  and Triglycerides were discussed with the patient.  2) Blood Pressure /Hypertension:   Blood pressure is controlled to target.  he is advised to continue his current medications including hydrochlorothiazide 12.5 mg p.o. daily mg p.o. daily with breakfast, Benicar 40 mg p.o. daily at breakfast.  3) Lipids/Hyperlipidemia:   Review of his recent lipid panel showed in improved lipid panel with LDL 83.  He is advised to continue atorvastatin  40 mg p.o. nightly.      Side effects and precautions discussed with him.  He will be considered for fasting lipid panel before his next visit.   4)  Weight/Diet: His BMI is 6.26 -post achieving adequate weight loss.   not a candidate for major weight loss.     I discussed with him the fact that loss of 5 - 10% of his  current body weight will have the most impact on his diabetes management.  Exercise, and detailed carbohydrates  information provided  -  detailed on discharge instructions.  5) Chronic Care/Health Maintenance:  -he  is on ACEI/ARB and Statin medications and  is encouraged to initiate and continue to follow up with Ophthalmology, Dentist,  Podiatrist at least yearly or according to recommendations, and advised to   stay away from smoking. I have recommended yearly flu vaccine and pneumonia vaccine at least every 5 years; moderate intensity exercise for up to 150 minutes weekly; and  sleep for at least 7 hours a day.  Diabetes foot exam is normal on July 26, 2024  - he is  advised to maintain close follow up with [facility] family medicine for primary care needs,  as well as his other providers for optimal and coordinated care.   I spent  40  minutes in the care of the patient today including review of labs from CMP, Lipids, Thyroid  Function, Hematology (current and previous including abstractions from other facilities); face-to-face time discussing  his blood glucose readings/logs, discussing hypoglycemia and hyperglycemia episodes and symptoms, medications doses, his options of short and long term treatment based on the latest standards of care / guidelines;  discussion about incorporating lifestyle medicine;  and documenting the encounter. Risk reduction counseling performed per USPSTF guidelines to reduce cardiovascular risk factors.     Please refer to Patient Instructions for Blood Glucose Monitoring  and Insulin/Medications Dosing Guide  in media tab for additional information. Please  also refer to  Patient Self Inventory in the Media  tab for reviewed elements of pertinent patient history.  Mario [name] participated in the discussions, expressed understanding, and voiced agreement with the above plans.  All questions were answered to his satisfaction. he is encouraged to contact clinic should he have any questions or concerns prior to his return visit.   Follow up plan: - Return in about 4 months  (around 11/26/2024) for Bring Meter/CGM Device/Logs- A1c in Office.  Ranny Earl, MD [facility] [place] [address] [place], [place] [address] Phone: [phone]  Fax: [phone]    07/26/2024, 11:22 AM  This note was partially dictated with voice recognition software. Similar sounding words can be transcribed inadequately or may not  be corrected upon review.

## 2024-08-11 ENCOUNTER — Other Ambulatory Visit: Payer: Self-pay | Admitting: "Endocrinology

## 2024-08-11 DIAGNOSIS — E1165 Type 2 diabetes mellitus with hyperglycemia: Secondary | ICD-10-CM

## 2024-08-16 ENCOUNTER — Other Ambulatory Visit: Payer: Self-pay | Admitting: "Endocrinology

## 2024-09-15 ENCOUNTER — Other Ambulatory Visit: Payer: Self-pay | Admitting: "Endocrinology

## 2024-09-15 DIAGNOSIS — E1165 Type 2 diabetes mellitus with hyperglycemia: Secondary | ICD-10-CM

## 2024-10-31 NOTE — Progress Notes (Signed)
 Mario [name]                                          MRN: [id number]   10/31/2024   The [facility] Quality Team Specialist reviewed this patient medical record for the purposes of chart review for care gap closure. The following were reviewed: chart review for care gap closure-kidney health evaluation for diabetes:eGFR  and uACR.    [facility] Quality Team

## 2024-11-03 ENCOUNTER — Other Ambulatory Visit: Payer: Self-pay | Admitting: "Endocrinology

## 2024-11-03 DIAGNOSIS — E1165 Type 2 diabetes mellitus with hyperglycemia: Secondary | ICD-10-CM

## 2024-11-13 ENCOUNTER — Other Ambulatory Visit: Payer: Self-pay | Admitting: "Endocrinology

## 2024-11-26 ENCOUNTER — Ambulatory Visit: Admitting: "Endocrinology

## 2024-11-27 ENCOUNTER — Ambulatory Visit: Admitting: "Endocrinology

## 2024-12-10 ENCOUNTER — Ambulatory Visit: Admitting: "Endocrinology
